# Patient Record
Sex: Male | Born: 1960 | Race: White | Hispanic: No | Marital: Single | Smoking: Former smoker
Health system: Southern US, Community
[De-identification: ages and names within clinical notes are randomized; demographics above are authoritative.]

## PROBLEM LIST (undated history)

## (undated) DIAGNOSIS — E785 Hyperlipidemia, unspecified: Secondary | ICD-10-CM

## (undated) DIAGNOSIS — N400 Enlarged prostate without lower urinary tract symptoms: Secondary | ICD-10-CM

## (undated) DIAGNOSIS — I214 Non-ST elevation (NSTEMI) myocardial infarction: Secondary | ICD-10-CM

## (undated) DIAGNOSIS — R112 Nausea with vomiting, unspecified: Secondary | ICD-10-CM

## (undated) DIAGNOSIS — I10 Essential (primary) hypertension: Secondary | ICD-10-CM

## (undated) DIAGNOSIS — E119 Type 2 diabetes mellitus without complications: Secondary | ICD-10-CM

## (undated) DIAGNOSIS — Z9889 Other specified postprocedural states: Secondary | ICD-10-CM

## (undated) HISTORY — DX: Non-ST elevation (NSTEMI) myocardial infarction: I21.4

## (undated) HISTORY — PX: TONSILLECTOMY: SUR1361

## (undated) HISTORY — PX: HERNIA REPAIR: SHX51

## (undated) HISTORY — DX: Type 2 diabetes mellitus without complications: E11.9

## (undated) HISTORY — DX: Hyperlipidemia, unspecified: E78.5

## (undated) HISTORY — DX: Benign prostatic hyperplasia without lower urinary tract symptoms: N40.0

## (undated) HISTORY — DX: Essential (primary) hypertension: I10

---

## 2010-02-11 ENCOUNTER — Emergency Department (HOSPITAL_COMMUNITY): Admission: EM | Admit: 2010-02-11 | Discharge: 2010-02-11 | Payer: Self-pay | Admitting: Emergency Medicine

## 2010-02-12 ENCOUNTER — Observation Stay (HOSPITAL_COMMUNITY): Admission: EM | Admit: 2010-02-12 | Discharge: 2010-02-13 | Payer: Self-pay | Admitting: Emergency Medicine

## 2010-03-08 ENCOUNTER — Encounter (INDEPENDENT_AMBULATORY_CARE_PROVIDER_SITE_OTHER): Payer: Self-pay | Admitting: *Deleted

## 2010-03-08 DIAGNOSIS — R079 Chest pain, unspecified: Secondary | ICD-10-CM

## 2010-03-08 LAB — CONVERTED CEMR LAB
CO2: 27 meq/L
Calcium: 8.9 mg/dL
Chloride: 106 meq/L
Creatinine, Ser: 0.97 mg/dL
Glomerular Filtration Rate, Af Am: >60 mL/min/{1.73_m2}
Glucose, Bld: 111 mg/dL

## 2010-03-13 ENCOUNTER — Encounter (INDEPENDENT_AMBULATORY_CARE_PROVIDER_SITE_OTHER): Payer: Self-pay | Admitting: *Deleted

## 2010-07-15 ENCOUNTER — Emergency Department (HOSPITAL_COMMUNITY): Admission: EM | Admit: 2010-07-15 | Discharge: 2010-07-15 | Payer: Self-pay | Admitting: Emergency Medicine

## 2010-07-19 ENCOUNTER — Ambulatory Visit (HOSPITAL_COMMUNITY): Admission: RE | Admit: 2010-07-19 | Discharge: 2010-07-19 | Payer: Self-pay | Admitting: General Surgery

## 2011-01-21 NOTE — Letter (Signed)
Summary: Appointment - Missed  [facility]  [address] [place] [address]   Phone: [phone]  Fax: [phone]     March 13, 2010 MRN: [id number]   RONSON HAGINS [address] [place], [place]  [address]   Dear Mr. MERGEN,  Our records indicate you missed your appointment on 03/13/10 DR MCDOWELL                               It is very important that we reach you to reschedule this appointment. We look forward to participating in your health care needs. Please contact us at the number listed above at your earliest convenience to reschedule this appointment.     Sincerely,    Glass blower/designer

## 2011-01-21 NOTE — Miscellaneous (Signed)
Summary: labs hosp 02/13/2010  Clinical Lists Changes  Observations: Added new observation of CALCIUM: 8.9 mg/dL (16/09/9603 54:09) Added new observation of GFR AA: >60 mL/min/1.65m2 (03/08/2010 12:23) Added new observation of GFR: >60 mL/min (03/08/2010 12:23) Added new observation of CREATININE: 0.97 mg/dL (81/19/1478 29:56) Added new observation of BUN: 10 mg/dL (21/30/8657 84:69) Added new observation of BG RANDOM: 111 mg/dL (62/95/2841 32:44) Added new observation of CO2 PLSM/SER: 27 meq/L (03/08/2010 12:23) Added new observation of CL SERUM: 106 meq/L (03/08/2010 12:23) Added new observation of K SERUM: 3.8 meq/L (03/08/2010 12:23) Added new observation of NA: 140 meq/L (03/08/2010 12:23) Added new observation of HGBA1C: 5.9 % (03/08/2010 12:23)

## 2011-03-08 LAB — CBC
MCH: 29.7 pg (ref 26.0–34.0)
MCV: 88.2 fL (ref 78.0–100.0)
Platelets: 295 10*3/uL (ref 150–400)
RBC: 5.02 MIL/uL (ref 4.22–5.81)
RDW: 14.5 % (ref 11.5–15.5)
WBC: 11.6 10*3/uL — ABNORMAL HIGH (ref 4.0–10.5)

## 2011-03-08 LAB — SURGICAL PCR SCREEN: Staphylococcus aureus: NEGATIVE

## 2011-03-08 LAB — BASIC METABOLIC PANEL
BUN: 9 mg/dL (ref 6–23)
CO2: 25 mEq/L (ref 19–32)
Calcium: 9.6 mg/dL (ref 8.4–10.5)
Creatinine, Ser: 0.69 mg/dL (ref 0.4–1.5)

## 2011-03-14 LAB — CARDIAC PANEL(CRET KIN+CKTOT+MB+TROPI)
Relative Index: INVALID (ref 0.0–2.5)
Relative Index: INVALID (ref 0.0–2.5)
Total CK: 69 U/L (ref 7–232)
Troponin I: 0.01 ng/mL (ref 0.00–0.06)
Troponin I: 0.01 ng/mL (ref 0.00–0.06)

## 2011-03-14 LAB — POCT CARDIAC MARKERS
CKMB, poc: <1 ng/mL — ABNORMAL LOW (ref 1.0–8.0)
CKMB, poc: <1 ng/mL — ABNORMAL LOW (ref 1.0–8.0)
Myoglobin, poc: 41.2 ng/mL (ref 12–200)
Myoglobin, poc: 50 ng/mL (ref 12–200)
Troponin i, poc: <0.05 ng/mL (ref 0.00–0.09)

## 2011-03-14 LAB — POCT I-STAT, CHEM 8
BUN: 10 mg/dL (ref 6–23)
HCT: 45 % (ref 39.0–52.0)
Hemoglobin: 15.3 g/dL (ref 13.0–17.0)
TCO2: 27 mmol/L (ref 0–100)

## 2011-03-14 LAB — CBC
MCHC: 33.8 g/dL (ref 30.0–36.0)
MCV: 88.9 fL (ref 78.0–100.0)
RBC: 4.74 MIL/uL (ref 4.22–5.81)

## 2011-03-14 LAB — COMPREHENSIVE METABOLIC PANEL
ALT: 16 U/L (ref 0–53)
AST: 14 U/L (ref 0–37)
Albumin: 3.7 g/dL (ref 3.5–5.2)
Alkaline Phosphatase: 73 U/L (ref 39–117)
BUN: 6 mg/dL (ref 6–23)
GFR calc Af Amer: >60 mL/min (ref >60)
GFR calc non Af Amer: >60 mL/min (ref >60)
Glucose, Bld: 119 mg/dL — ABNORMAL HIGH (ref 70–99)
Potassium: 3.7 mEq/L (ref 3.5–5.1)
Sodium: 138 mEq/L (ref 135–145)
Total Bilirubin: 0.5 mg/dL (ref 0.3–1.2)
Total Protein: 6.3 g/dL (ref 6.0–8.3)

## 2011-03-14 LAB — BASIC METABOLIC PANEL
CO2: 27 mEq/L (ref 19–32)
Calcium: 8.9 mg/dL (ref 8.4–10.5)
Chloride: 106 mEq/L (ref 96–112)
GFR calc Af Amer: >60 mL/min (ref >60)
GFR calc non Af Amer: >60 mL/min (ref >60)
Potassium: 3.8 mEq/L (ref 3.5–5.1)
Sodium: 140 mEq/L (ref 135–145)

## 2011-03-14 LAB — DIFFERENTIAL
Basophils Absolute: 0 10*3/uL (ref 0.0–0.1)
Basophils Relative: 0 % (ref 0–1)
Lymphocytes Relative: 31 % (ref 12–46)
Monocytes Absolute: 0.7 10*3/uL (ref 0.1–1.0)
Neutro Abs: 6.4 10*3/uL (ref 1.7–7.7)
Neutrophils Relative %: 61 % (ref 43–77)

## 2011-03-14 LAB — LIPID PANEL
HDL: 35 mg/dL — ABNORMAL LOW (ref >39)
LDL Cholesterol: 103 mg/dL — ABNORMAL HIGH (ref 0–99)

## 2011-03-14 LAB — HEMOGLOBIN A1C: Hgb A1c MFr Bld: 5.9 % (ref 4.6–6.1)

## 2011-07-29 ENCOUNTER — Emergency Department (HOSPITAL_COMMUNITY): Payer: BC Managed Care – PPO

## 2011-07-29 ENCOUNTER — Other Ambulatory Visit: Payer: Self-pay

## 2011-07-29 ENCOUNTER — Emergency Department (HOSPITAL_COMMUNITY)
Admission: EM | Admit: 2011-07-29 | Discharge: 2011-07-29 | Disposition: A | Discharge status: Home / Self Care | Payer: BC Managed Care – PPO | Attending: Emergency Medicine | Admitting: Emergency Medicine

## 2011-07-29 ENCOUNTER — Encounter: Payer: Self-pay | Admitting: Emergency Medicine

## 2011-07-29 DIAGNOSIS — R079 Chest pain, unspecified: Secondary | ICD-10-CM

## 2011-07-29 DIAGNOSIS — M79609 Pain in unspecified limb: Secondary | ICD-10-CM | POA: Insufficient documentation

## 2011-07-29 LAB — CBC
HCT: 43.9 % (ref 39.0–52.0)
MCH: 29.4 pg (ref 26.0–34.0)
MCHC: 33.9 g/dL (ref 30.0–36.0)
Platelets: 305 10*3/uL (ref 150–400)
WBC: 11.5 10*3/uL — ABNORMAL HIGH (ref 4.0–10.5)

## 2011-07-29 LAB — COMPREHENSIVE METABOLIC PANEL
ALT: 29 U/L (ref 0–53)
AST: 19 U/L (ref 0–37)
Albumin: 3.9 g/dL (ref 3.5–5.2)
BUN: 13 mg/dL (ref 6–23)
Creatinine, Ser: 0.72 mg/dL (ref 0.50–1.35)
Total Protein: 7.2 g/dL (ref 6.0–8.3)

## 2011-07-29 LAB — CARDIAC PANEL(CRET KIN+CKTOT+MB+TROPI)
Relative Index: INVALID (ref 0.0–2.5)
Total CK: 93 U/L (ref 7–232)
Troponin I: <0.3 ng/mL (ref <0.30)

## 2011-07-29 MED ORDER — SODIUM CHLORIDE 0.9 % IV SOLN
20.0000 mL | INTRAVENOUS | Status: DC
  Administered 2011-07-29: 1000 mL via INTRAVENOUS

## 2011-07-29 MED ORDER — NITROGLYCERIN 0.4 MG SL SUBL
0.4000 mg | SUBLINGUAL_TABLET | Freq: Once | SUBLINGUAL | Status: AC
  Administered 2011-07-29: 0.4 mg via SUBLINGUAL
  Filled 2011-07-29: qty 25

## 2011-07-29 NOTE — ED Notes (Signed)
Patient reports 2/10 chest pain with Nitro SL. Patient also stating the nitro helped with his headache too. BP 118/78 after Nitro.

## 2011-07-29 NOTE — ED Provider Notes (Signed)
History   Chart scribed for Joya Gaskins, MD by Enos Fling; the patient was seen in room APA05/APA05; this patient's care was started at 10:20 AM.    CSN: [id number] Arrival date & time: 07/29/2011 10:15 AM  Chief Complaint  Patient presents with  . Chest Pain   HPI Seth [name] is a 50 y.o. male who presents to the Emergency Department complaining of chest pain. Pt reports chest pain onset 10 days ago, described as intermittent soreness and pressure in his central chest (pain is present "half the time I am awake"). Pain radiates to left arm occasionally, is worse when lying or sitting still, but better with walking around and activity. Since onset of pain, pt has been drinking a lot of water, stopped smoking, and has been taking 4 ASA a day. 2 ASA today. Current pain described as a mild soreness. Pt states diaphoresis is usual for him and is unchanged, states he does not do well in the heat and he has been outside a lot in it lately. No n/v, abd pain, numbness, or focal weakness.. Also c/o headache right now.  PAST MEDICAL HISTORY:  Past Medical History  Diagnosis Date  . Unstable angina     PAST SURGICAL HISTORY:  Past Surgical History  Procedure Date  . Hernia repair   . Tonsillectomy     MEDICATIONS:  Previous Medications   ASPIRIN 325 MG TABLET    Take 325 mg by mouth every 6 (six) hours as needed. For chest pain    FISH OIL-OMEGA-3 FATTY ACIDS 1000 MG CAPSULE    Take 1 g by mouth daily.     IBUPROFEN (ADVIL,MOTRIN) 200 MG TABLET    Take 800 mg by mouth every 6 (six) hours as needed. For pain      ALLERGIES:  Allergies as of 07/29/2011 - Review Complete 07/29/2011  Allergen Reaction Noted  . Penicillins Other (See Comments) 03/08/2010     FAMILY HISTORY:  Family History  Problem Relation Age of Onset  . Cancer Mother   . Cancer Father   . Anuerysm Sister   . Anuerysm Brother      SOCIAL HISTORY: History   Social History  . Marital Status: Single    Spouse Name: N/A    Number of Children: N/A  . Years of Education: N/A   Social History Main Topics  . Smoking status: Former Smoker    Quit date: 07/19/2011  . Smokeless tobacco: Never Used  . Alcohol Use: No  . Drug Use: .5 per week    Special: Marijuana  . Sexually Active: No   Other Topics Concern  . None   Social History Narrative  . None      Review of Systems 10 Systems reviewed and are negative for acute change except as noted in the HPI.  Physical Exam  BP 122/62  Pulse 78  Temp(Src) 98.7 F (37.1 C) (Oral)  Resp 20  Ht 6\' 3"  (1.905 m)  Wt 228 lb (103.42 kg)  BMI 28.50 kg/m2  SpO2 96%  Physical Exam CONSTITUTIONAL: Well developed/well nourished HEAD AND FACE: Normocephalic/atraumatic EYES: EOMI/PERRL ENMT: Mucous membranes moist NECK: supple no meningeal signs SPINE:entire spine nontender CV: S1/S2 noted, no murmurs/rubs/gallops noted LUNGS: Lungs are clear to auscultation bilaterally, no apparent distress ABDOMEN: soft, nontender, no rebound or guarding GU:no cva tenderness NEURO: Pt is awake/alert, moves all extremitiesx4 EXTREMITIES: pulses normal, full ROM SKIN: warm, color normal PSYCH: no abnormalities of mood noted  ED Course  Procedures  OTHER DATA REVIEWED: Nursing notes, vital signs, and past medical records reviewed. All labs/vitals reviewed and considered xrays reviewed and considered  Pt admitted in 2011 for chest pain, did not have any provocative imaging/cath  DIAGNOSTIC STUDIES: Oxygen Saturation is 96% on room air, normal by my interpretation.    Date: 07/29/2011  Rate: 86  Rhythm: normal sinus rhythm  QRS Axis: normal  Intervals: normal  ST/T Wave abnormalities: nonspecific ST changes  Conduction Disutrbances:none  Narrative Interpretation:   Old EKG Reviewed: unchanged  3:12 PM - Repeat EKG unchanged from prior EKG today.   LABS / RADIOLOGY: labs unremarkable, cxr reviewed   MDM: On reexam, pt reports pain is  burning at times, no sob/nausea with pain, actually improved with exertion He had no change with NTG On re-eval my suspicion for ACS is low.  (1:11 PM) Pt had told nursing he had unstable angina before but I seen no record of this D/w dr Dietrich Pates, will f/u in office with likely stress imaging 3:12 PM - Repeat EKG unchanged from prior EKG today. Repeat Troponin also normal. Will discharge with outpatient cardiology f/u.   IMPRESSION: Chest pain     PLAN:  Discharge home The patient is to return the emergency department if there is any worsening of symptoms. I have reviewed the discharge instructions with the patient   CONDITION ON DISCHARGE: stable   MEDICATIONS GIVEN IN THE E.D.  Medications  0.9 %  sodium chloride infusion (1000 mL Intravenous New Bag 07/29/11 1037)        nitroGLYCERIN (NITROSTAT) SL tablet 0.4 mg (0.4 mg Sublingual Given 07/29/11 1039)     DISCHARGE MEDICATIONS: New Prescriptions   No medications on file    I personally performed the services described in this documentation, which was scribed in my presence. The recorded information has been reviewed and considered. Joya Gaskins, MD        Joya Gaskins, MD 07/29/11 [phone]

## 2011-07-29 NOTE — ED Notes (Addendum)
Patient c/o left side chest pain that started 10 days ago. Per patient pain comes and goes. Patient states "It doesn't hurt when I'm working and stuff. It hurts when I'm sitting or lying." Per patient a lot of stress and contributed it to heat. Patient reports "I quit smoking when it started, drank lots of water, and have been taking aspirin but it hasn't gotten any better." Patient states pain now radiates into left arm and left hip. Patient also reports, dizziness, facial numbness, diaphoresis, and weakness. No diaphoresis noted at this time. Denies any nausea, vomiting, or shortness of breath. HX of unstable angina. Patient also c/o constant headache.

## 2011-07-29 NOTE — ED Notes (Signed)
Patient denies chest pain at this time. Skin warm/dry. Patient resting in bed. Will continue to monitor.

## 2011-08-04 ENCOUNTER — Ambulatory Visit (INDEPENDENT_AMBULATORY_CARE_PROVIDER_SITE_OTHER): Payer: BC Managed Care – PPO | Admitting: Adult Health

## 2011-08-04 ENCOUNTER — Encounter: Payer: Self-pay | Admitting: Adult Health

## 2011-08-04 DIAGNOSIS — IMO0001 Reserved for inherently not codable concepts without codable children: Secondary | ICD-10-CM

## 2011-08-04 DIAGNOSIS — M7918 Myalgia, other site: Secondary | ICD-10-CM | POA: Insufficient documentation

## 2011-08-04 DIAGNOSIS — R079 Chest pain, unspecified: Secondary | ICD-10-CM

## 2011-08-04 MED ORDER — NAPROXEN 375 MG PO TABS: 375.0000 mg | ORAL_TABLET | Freq: Three times a day (TID) | ORAL | Status: AC

## 2011-08-04 MED ORDER — CYCLOBENZAPRINE HCL 10 MG PO TABS: 10.0000 mg | ORAL_TABLET | Freq: Three times a day (TID) | ORAL | Status: AC | PRN

## 2011-08-04 NOTE — Progress Notes (Signed)
HPI:   Allergies  Allergen Reactions  . Penicillins Other (See Comments)    Childhood allergy    Current Outpatient Prescriptions  Medication Sig Dispense Refill  . aspirin 325 MG tablet Take 325 mg by mouth every 6 (six) hours as needed. For chest pain       . fish oil-omega-3 fatty acids 1000 MG capsule Take 1 g by mouth daily.        Marland Kitchen ibuprofen (ADVIL,MOTRIN) 200 MG tablet Take 800 mg by mouth every 6 (six) hours as needed. For pain       . cyclobenzaprine (FLEXERIL) 10 MG tablet Take 1 tablet (10 mg total) by mouth 3 (three) times daily as needed for muscle spasms.  90 tablet  0  . naproxen (NAPROSYN) 375 MG tablet Take 1 tablet (375 mg total) by mouth 3 (three) times daily with meals.  15 tablet  2    No past medical history on file.  Past Surgical History  Procedure Date  . Hernia repair   . Tonsillectomy   ROS: Review of systems complete and found to be negative unless listed above  PE::General: Well developed, well nourished, in no acute distress Head: Eyes PERRLA, No xanthomas.   Normal cephalic and atramatic  Lungs: Clear bilaterally to auscultation and percussion. Heart: HRRR S1 S2,.  Pulses are 2+ & equal.            No carotid bruit. No JVD.  No abdominal bruits. No femoral bruits. Abdomen: Bowel sounds are positive, abdomen soft and non-tender without masses or                  Hernia's noted. Msk:  Back normal, normal gait. Normal strength and tone for age. Extremities: No clubbing, cyanosis or edema.  DP +1, soreness with moving the neck and left arm and shoulder. Neuro: Alert and oriented X 3. Psych:  Good affect, responds appropriately BP 120/77  Pulse 79  Resp 18  Ht 6\' 3"  (1.905 m)  Wt 226 lb (102.513 kg)  BMI 28.25 kg/m2  SpO2 97%  EKG:NSR rate of 71 bpm  ASSESSMENT AND PLAN

## 2011-08-04 NOTE — Assessment & Plan Note (Signed)
This pain appears to be atypical for cardiac pain. Usually associated with movement. It is reproducible with movement of the head, left shouldr and left arm, but not severe.  EKG is normal.  He has been seen and examined by Dr. Dietrich Pates and myself.  Will start him on Naprosyn 375 mg TID with food for 5 days, then prn.  He will also be started on flexeril 10 mg TID for muscle spasms, taken prn.  He will return to [place] PRN only. If pain does not subside, should be seen by orthopedic physician.

## 2011-08-04 NOTE — Patient Instructions (Signed)
Your physician has recommended you make the following change in your medication: Take Flexeril 10 mg three times a day as needed and Naprosyn 375 mg three times a day with food for 5 days  Your physician recommends that you schedule a follow-up appointment in: as needed

## 2012-08-21 IMAGING — CR DG CHEST 2V
3 series · 3 of 3 positions shown · non-contrast
Comparison: 02/11/2010

CLINICAL DATA: chest pain.

CHEST - 2 VIEW

[view not recorded (1 of 3)]
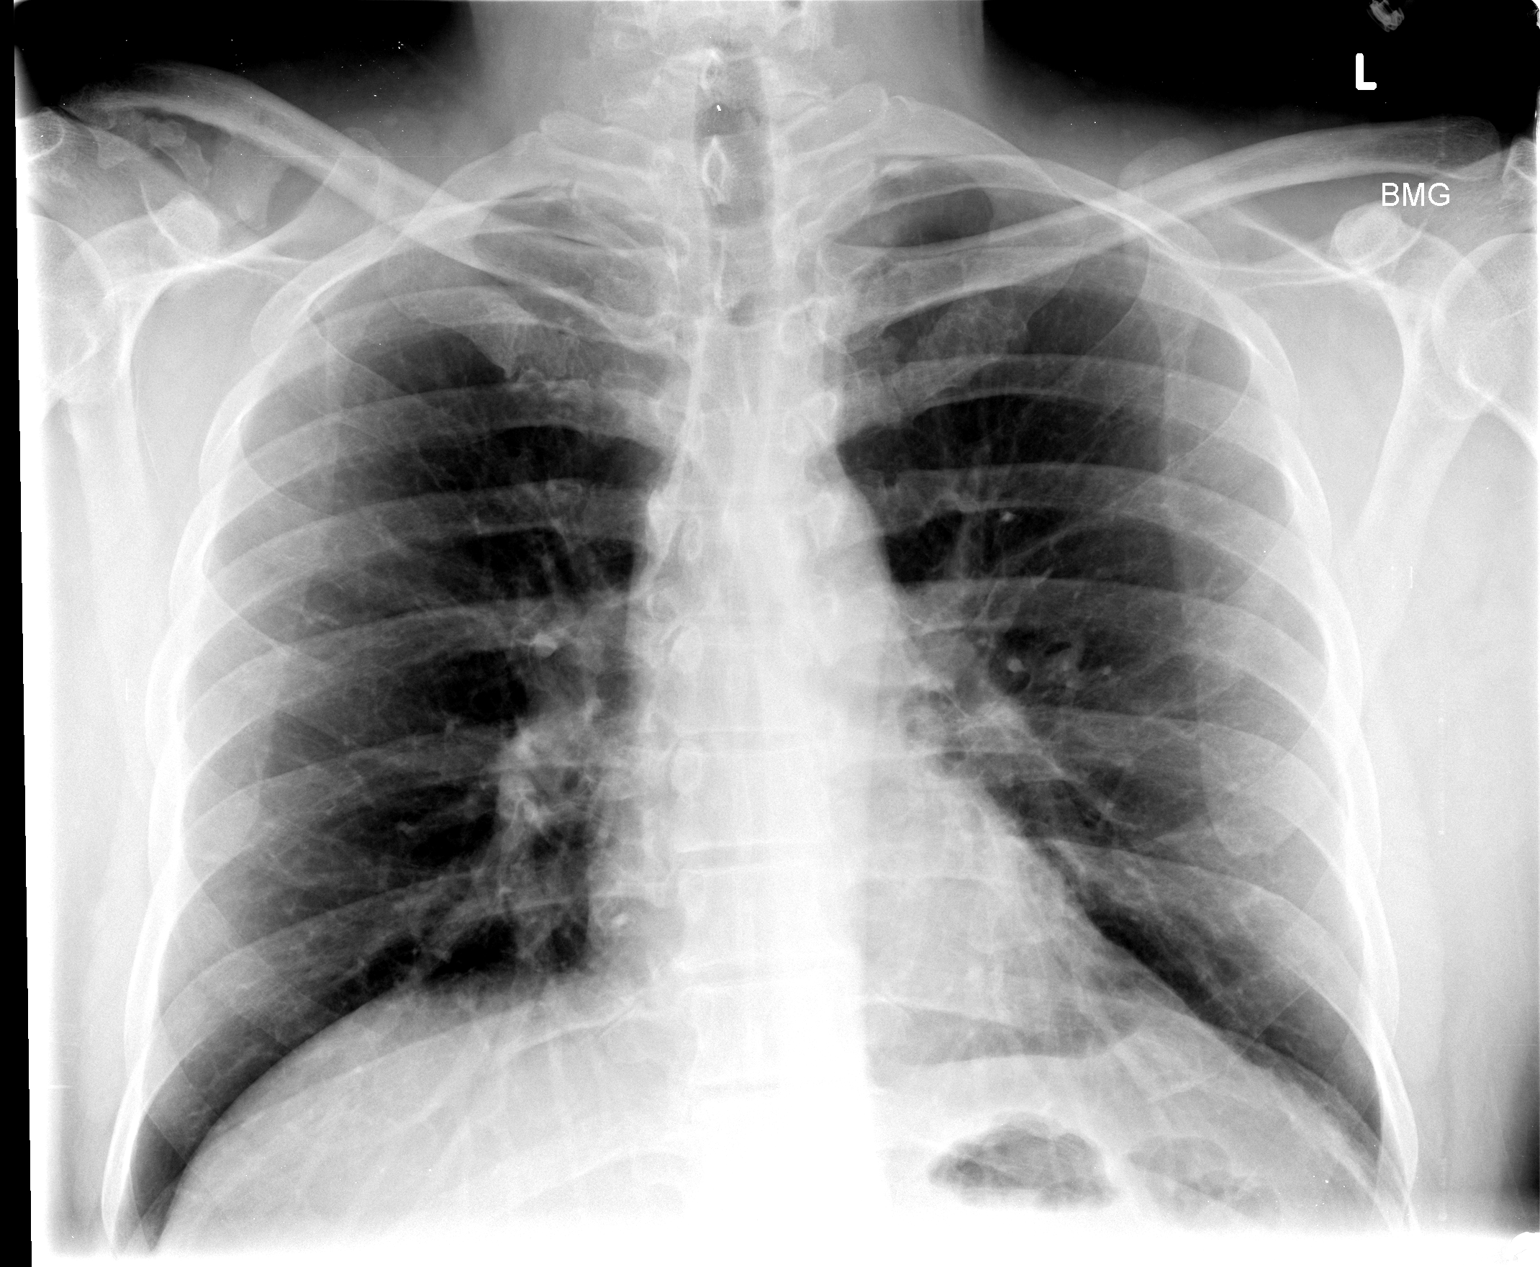

[view not recorded (2 of 3)]
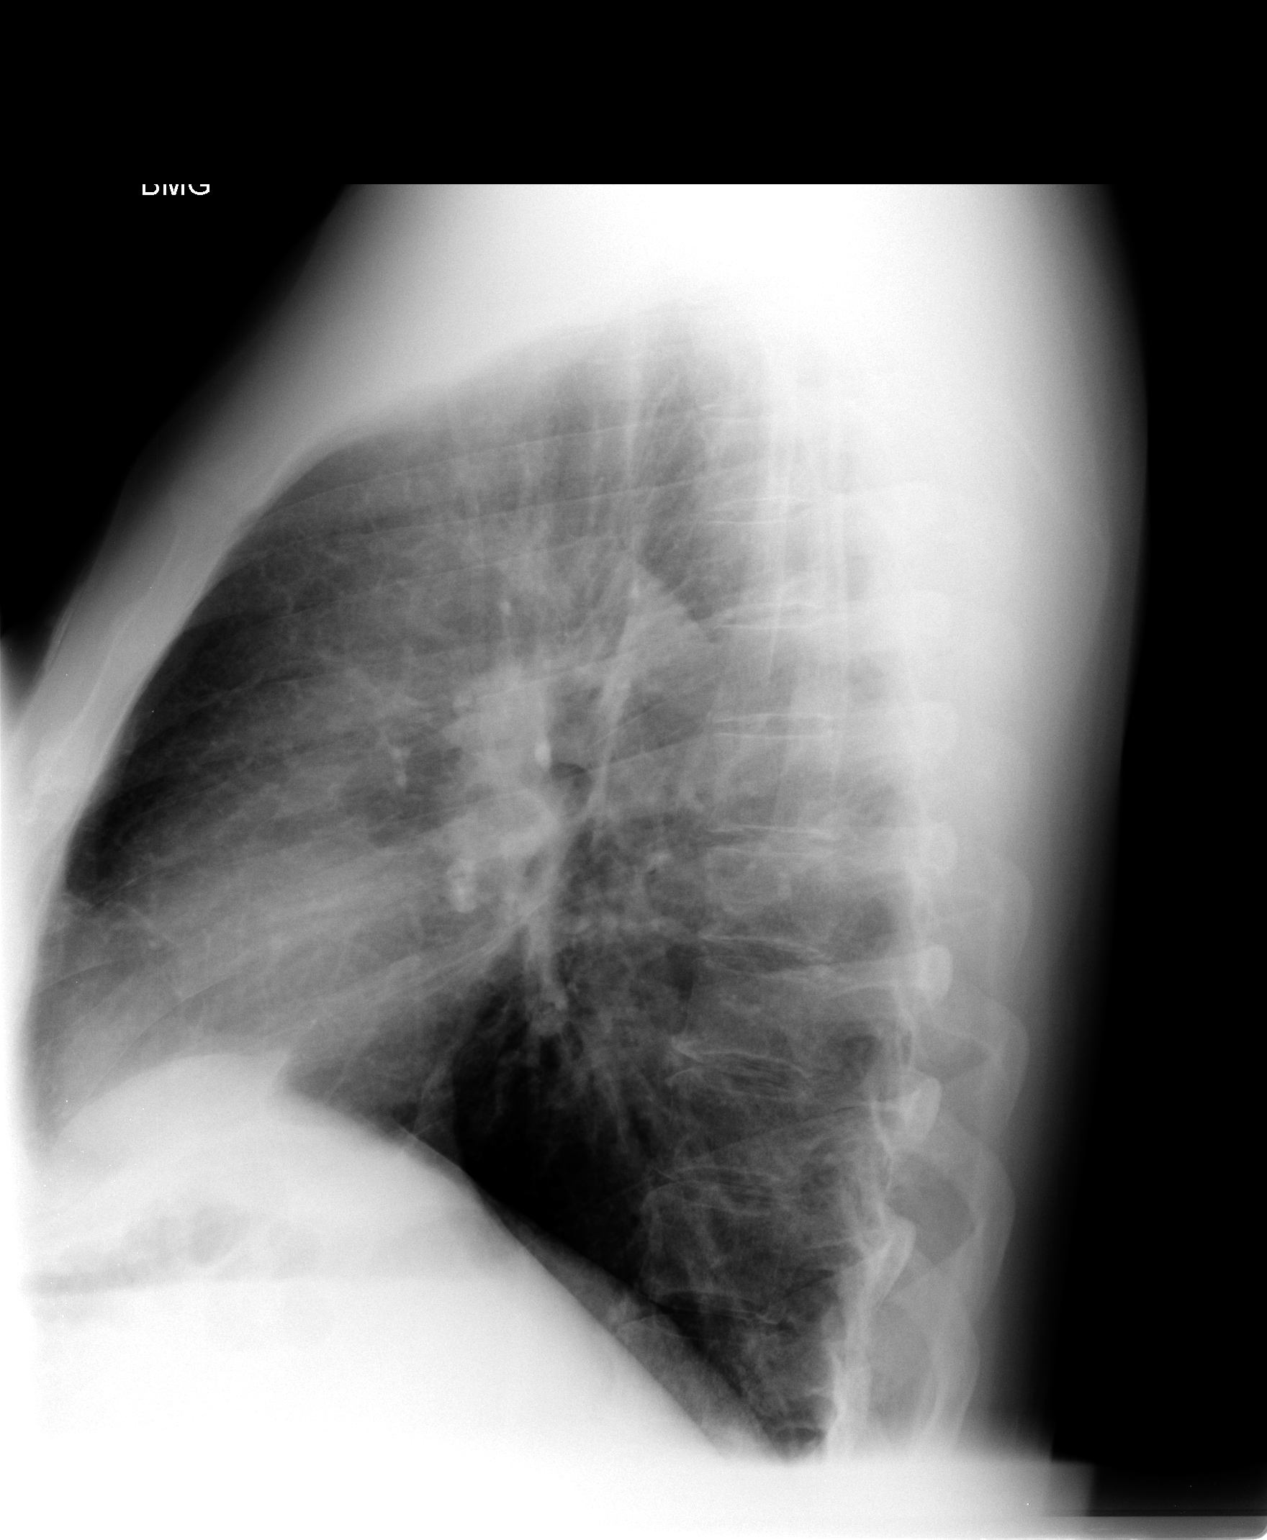

[view not recorded (3 of 3)]
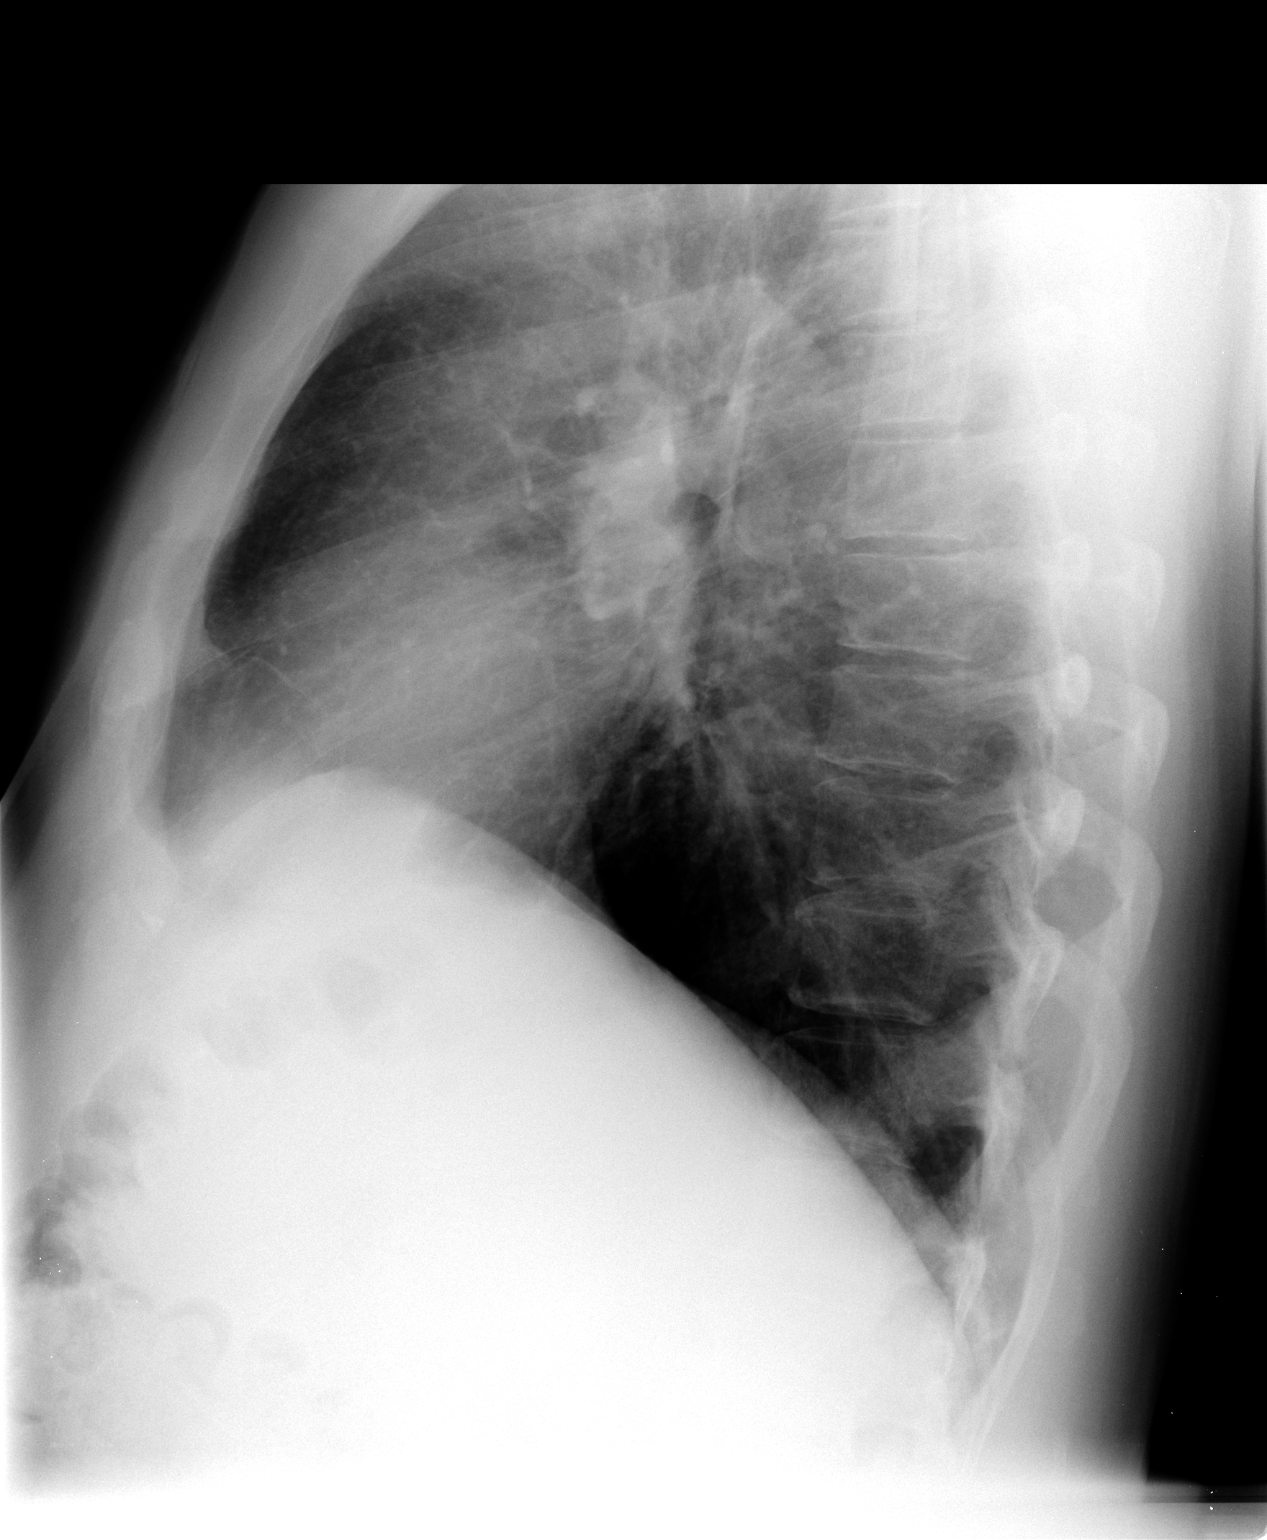

[3 of 3 positions shown; findings below may reference images not displayed]

FINDINGS: Unchanged cardiac silhouette and mediastinal contours.
No focal parenchymal opacities.  No pleural effusion or
pneumothorax.  Redemonstrated likely benign bony exercrences
arising from the inferior surface of the right clavicle, grossly
unchanged, though incompletely evaluated.  No acute osseous
abnormalities.
IMPRESSION: No acute cardiopulmonary disease.

## 2015-02-13 NOTE — H&P (Signed)
  NTS SOAP Note  Vital Signs:  Vitals as of: 02/08/2015: Systolic 142: Diastolic 81: Heart Rate 85: Temp 98.79F: Height 256ft 3in: Weight 240Lbs 0 Ounces: Pain Level 3: BMI 30  BMI : 30 kg/m2  Subjective: This 54 year old male presents for of a left inguinal hernia.  Was lifting a heavy dog at work and developed pain and swelling in the left groin region in Nov of 2015,   Has not resolved since that time.  Did not have these symptoms prior to this incident.  Made worse with straining.  Lump does get smaller when lying down.  Review of Symptoms:  Constitutional:unremarkable   Head:unremarkable Eyes:unremarkable   Nose/Mouth/Throat:unremarkable Cardiovascular:  unremarkable Respiratory:unremarkable Gastrointestinabdominal pain Genitourinary:unremarkable   back pain Skin:unremarkable Hematolgic/Lymphatic:unremarkable   hay fever   Past Medical History:  Reviewed  Past Medical History  Surgical History: RIH Medical Problems: none Allergies: pcn Medications: none   Social History:Reviewed  Social History  Preferred Language: English Race:  White Ethnicity: Not Hispanic / Latino Age: [age] year Marital Status:  S Alcohol: unknown   Smoking Status: Current every day smoker reviewed on 02/08/2015 Started Date:  Packs per day: 0.50 Functional Status reviewed on 02/08/2015 ------------------------------------------------ Bathing: Normal Cooking: Normal Dressing: Normal Driving: Normal Eating: Normal Managing Meds: Normal Oral Care: Normal Shopping: Normal Toileting: Normal Transferring: Normal Walking: Normal Cognitive Status reviewed on 02/08/2015 ------------------------------------------------ Attention: Normal Decision Making: Normal Language: Normal Memory: Normal Motor: Normal Perception: Normal Problem Solving: Normal Visual and Spatial: Normal   Family History:Reviewed  Family Health History Mother, Living; Healthy;  Father,  Deceased; Healthy;     Objective Information: General:Well appearing, well nourished in no distress. Heart:RRR, no murmur or gallop.  Normal S1, S2.  No S3, S4.  Lungs:  CTA bilaterally, no wheezes, rhonchi, rales.  Breathing unlabored. Abdomen:Soft, NT/ND, no HSM, no masses.  Reducible left inguinal hernia. WU:[id number]:unremarkable    Assessment:Left inguinal hernia  Diagnoses: 550.90  K40.90 Inguinal hernia (Unilateral inguinal hernia, without obstruction or gangrene, not specified as recurrent)  Procedures: [id number] - OFFICE OUTPATIENT NEW 30 MINUTES    Plan:  Scheduled for left inguinal herniorrhaphy with mesh on 02/16/15.   Patient Education:Alternative treatments to surgery were discussed with patient (and family).  Risks and benefits  of procedure including bleeding,  infection,  mesh use,  and recurrence of the hernia were fully explained to the patient (and family) who gave informed consent. Patient/family questions were addressed.  Follow-up:Pending Surgery

## 2015-02-14 NOTE — Patient Instructions (Addendum)
Seth [name]  02/14/2015   Your procedure is scheduled on:  02/16/2015  [facility] at [facility]:15 AM.  Call this number if you have problems the morning of surgery: [phone]   Remember:   Do not eat food or drink liquids after midnight.   Take these medicines the morning of surgery with A SIP OF WATER: *none**   Do not wear jewelry, make-up or nail polish.  Do not wear lotions, powders, or perfumes. You may wear deodorant.  Do not shave 48 hours prior to surgery. Men may shave face and neck.  Do not bring valuables to the hospital.  [facility] not [facility]                  for any belongings or valuables.               Contacts, dentures or bridgework may not be worn into surgery.  Leave suitcase in the car. After surgery it may be brought to your room.  For patients admitted to the hospital, discharge time is determined by your                treatment team.               Patients discharged the day of surgery will not be allowed to drive  home.  Name and phone number of your driver: family  Special Instructions: Shower using CHG 2 nights before surgery and the night before surgery.  If you shower the day of surgery use CHG.  Use special wash - you have one bottle of CHG for all showers.  You should use approximately 1/3 of the bottle for each shower.   Please read over the following fact sheets that you were given: Anesthesia Post-op Instructions        PATIENT INSTRUCTIONS POST-ANESTHESIA  IMMEDIATELY FOLLOWING SURGERY:  Do not drive or operate machinery for the first twenty four hours after surgery.  Do not make any important decisions for twenty four hours after surgery or while taking narcotic pain medications or sedatives.  If you develop intractable nausea and vomiting or a severe headache please notify your doctor immediately.  FOLLOW-UP:  Please make an appointment with your surgeon as instructed. You do not need to follow up with anesthesia unless  specifically instructed to do so.  WOUND CARE INSTRUCTIONS (if applicable):  Keep a dry clean dressing on the anesthesia/puncture wound site if there is drainage.  Once the wound has quit draining you may leave it open to air.  Generally you should leave the bandage intact for twenty four hours unless there is drainage.  If the epidural site drains for more than 36-48 hours please call the anesthesia department.  QUESTIONS?:  Please feel free to call your physician or the hospital operator if you have any questions, and they will be happy to assist you.      Hernia A hernia occurs when an internal organ pushes out through a weak spot in the abdominal wall. Hernias most commonly occur in the groin and around the navel. Hernias often can be pushed back into place (reduced). Most hernias tend to get worse over time. Some abdominal hernias can get stuck in the opening (irreducible or incarcerated hernia) and cannot be reduced. An irreducible abdominal hernia which is tightly squeezed into the opening is at risk for impaired blood supply (strangulated hernia). A strangulated hernia is a medical emergency. Because of the risk for an  irreducible or strangulated hernia, surgery may be recommended to repair a hernia. CAUSES   Heavy lifting.  Prolonged coughing.  Straining to have a bowel movement.  A cut (incision) made during an abdominal surgery. HOME CARE INSTRUCTIONS   Bed rest is not required. You may continue your normal activities.  Avoid lifting more than 10 pounds (4.5 kg) or straining.  Cough gently. If you are a smoker it is best to stop. Even the best hernia repair can break down with the continual strain of coughing. Even if you do not have your hernia repaired, a cough will continue to aggravate the problem.  Do not wear anything tight over your hernia. Do not try to keep it in with an outside bandage or truss. These can damage abdominal contents if they are trapped within the hernia  sac.  Eat a normal diet.  Avoid constipation. Straining over long periods of time will increase hernia size and encourage breakdown of repairs. If you cannot do this with diet alone, stool softeners may be used. SEEK IMMEDIATE MEDICAL CARE IF:   You have a fever.  You develop increasing abdominal pain.  You feel nauseous or vomit.  Your hernia is stuck outside the abdomen, looks discolored, feels hard, or is tender.  You have any changes in your bowel habits or in the hernia that are unusual for you.  You have increased pain or swelling around the hernia.  You cannot push the hernia back in place by applying gentle pressure while lying down. MAKE SURE YOU:   Understand these instructions.  Will watch your condition.  Will get help right away if you are not doing well or get worse. Document Released: 12/08/2005 Document Revised: 03/01/2012 Document Reviewed: 07/27/2008 [facility] Patient Information 2015 [place], [place]. This information is not intended to replace advice given to you by your health care provider. Make sure you discuss any questions you have with your health care provider.

## 2015-02-15 ENCOUNTER — Encounter (HOSPITAL_COMMUNITY): Payer: Self-pay

## 2015-02-15 ENCOUNTER — Encounter (HOSPITAL_COMMUNITY)
Admission: RE | Admit: 2015-02-15 | Discharge: 2015-02-15 | Disposition: A | Discharge status: Home / Self Care | Payer: Worker's Compensation | Source: Ambulatory Visit | Attending: General Surgery | Admitting: General Surgery

## 2015-02-15 DIAGNOSIS — K4090 Unilateral inguinal hernia, without obstruction or gangrene, not specified as recurrent: Secondary | ICD-10-CM | POA: Insufficient documentation

## 2015-02-15 DIAGNOSIS — Z01818 Encounter for other preprocedural examination: Secondary | ICD-10-CM | POA: Insufficient documentation

## 2015-02-15 HISTORY — DX: Nausea with vomiting, unspecified: R11.2

## 2015-02-15 HISTORY — DX: Other specified postprocedural states: Z98.890

## 2015-02-15 LAB — CBC WITH DIFFERENTIAL/PLATELET
Basophils Absolute: 0 10*3/uL (ref 0.0–0.1)
Basophils Relative: 0 % (ref 0–1)
EOS ABS: 0.3 10*3/uL (ref 0.0–0.7)
Eosinophils Relative: 2 % (ref 0–5)
HCT: 44 % (ref 39.0–52.0)
Hemoglobin: 14.8 g/dL (ref 13.0–17.0)
LYMPHS ABS: 3.1 10*3/uL (ref 0.7–4.0)
Lymphocytes Relative: 28 % (ref 12–46)
MCH: 29.7 pg (ref 26.0–34.0)
MCHC: 33.6 g/dL (ref 30.0–36.0)
MCV: 88.4 fL (ref 78.0–100.0)
MONOS PCT: 7 % (ref 3–12)
Monocytes Absolute: 0.8 10*3/uL (ref 0.1–1.0)
NEUTROS PCT: 63 % (ref 43–77)
Neutro Abs: 7 10*3/uL (ref 1.7–7.7)
Platelets: 327 10*3/uL (ref 150–400)
RBC: 4.98 MIL/uL (ref 4.22–5.81)
RDW: 14.4 % (ref 11.5–15.5)
WBC: 11.2 10*3/uL — AB (ref 4.0–10.5)

## 2015-02-15 LAB — BASIC METABOLIC PANEL
Anion gap: 3 — ABNORMAL LOW (ref 5–15)
BUN: 12 mg/dL (ref 6–23)
CALCIUM: 9 mg/dL (ref 8.4–10.5)
CO2: 25 mmol/L (ref 19–32)
CREATININE: 0.66 mg/dL (ref 0.50–1.35)
Chloride: 109 mmol/L (ref 96–112)
GFR calc Af Amer: >90 mL/min (ref >90)
GLUCOSE: 128 mg/dL — AB (ref 70–99)
Potassium: 4.3 mmol/L (ref 3.5–5.1)
SODIUM: 137 mmol/L (ref 135–145)

## 2015-02-15 NOTE — Pre-Procedure Instructions (Signed)
Patient given information to sign up for my chart at home. 

## 2015-02-16 ENCOUNTER — Ambulatory Visit (HOSPITAL_COMMUNITY)
Admission: RE | Admit: 2015-02-16 | Discharge: 2015-02-16 | Disposition: A | Discharge status: Home / Self Care | Payer: BLUE CROSS/BLUE SHIELD | Source: Ambulatory Visit | Attending: General Surgery | Admitting: General Surgery

## 2015-02-16 ENCOUNTER — Ambulatory Visit (HOSPITAL_COMMUNITY): Payer: BLUE CROSS/BLUE SHIELD | Admitting: Anesthesiology

## 2015-02-16 ENCOUNTER — Encounter (HOSPITAL_COMMUNITY)
Admission: RE | Disposition: A | Discharge status: Home / Self Care | Payer: Self-pay | Source: Ambulatory Visit | Attending: General Surgery

## 2015-02-16 ENCOUNTER — Encounter (HOSPITAL_COMMUNITY): Payer: Self-pay | Admitting: *Deleted

## 2015-02-16 DIAGNOSIS — Z87891 Personal history of nicotine dependence: Secondary | ICD-10-CM | POA: Diagnosis not present

## 2015-02-16 DIAGNOSIS — K4090 Unilateral inguinal hernia, without obstruction or gangrene, not specified as recurrent: Secondary | ICD-10-CM | POA: Diagnosis not present

## 2015-02-16 HISTORY — PX: INSERTION OF MESH: SHX5868

## 2015-02-16 HISTORY — PX: INGUINAL HERNIA REPAIR: SHX194

## 2015-02-16 SURGERY — REPAIR, HERNIA, INGUINAL, ADULT
Anesthesia: General | Site: Abdomen | Laterality: Left

## 2015-02-16 MED ORDER — DEXAMETHASONE SODIUM PHOSPHATE 4 MG/ML IJ SOLN
4.0000 mg | Freq: Once | INTRAMUSCULAR | Status: AC
  Administered 2015-02-16: 4 mg via INTRAVENOUS

## 2015-02-16 MED ORDER — LACTATED RINGERS IV SOLN
INTRAVENOUS | Status: DC
  Administered 2015-02-16: 1000 mL via INTRAVENOUS

## 2015-02-16 MED ORDER — ONDANSETRON HCL 4 MG/2ML IJ SOLN
INTRAMUSCULAR | Status: AC
  Filled 2015-02-16: qty 2

## 2015-02-16 MED ORDER — FENTANYL CITRATE 0.05 MG/ML IJ SOLN
25.0000 ug | INTRAMUSCULAR | Status: AC
  Administered 2015-02-16 (×2): 25 ug via INTRAVENOUS

## 2015-02-16 MED ORDER — SUCCINYLCHOLINE CHLORIDE 20 MG/ML IJ SOLN
INTRAMUSCULAR | Status: AC
  Filled 2015-02-16: qty 1

## 2015-02-16 MED ORDER — OXYCODONE-ACETAMINOPHEN 7.5-325 MG PO TABS: 1.0000 | ORAL_TABLET | ORAL | Status: DC | PRN

## 2015-02-16 MED ORDER — SODIUM CHLORIDE 0.9 % IV SOLN
INTRAVENOUS | Status: DC | PRN
  Administered 2015-02-16: 07:00:00 via INTRAVENOUS

## 2015-02-16 MED ORDER — KETOROLAC TROMETHAMINE 30 MG/ML IJ SOLN
30.0000 mg | Freq: Once | INTRAMUSCULAR | Status: AC
  Administered 2015-02-16: 30 mg via INTRAVENOUS
  Filled 2015-02-16: qty 1

## 2015-02-16 MED ORDER — SCOPOLAMINE 1 MG/3DAYS TD PT72
MEDICATED_PATCH | TRANSDERMAL | Status: AC
  Filled 2015-02-16: qty 1

## 2015-02-16 MED ORDER — PROPOFOL 10 MG/ML IV BOLUS
INTRAVENOUS | Status: AC
  Filled 2015-02-16: qty 20

## 2015-02-16 MED ORDER — FENTANYL CITRATE 0.05 MG/ML IJ SOLN
INTRAMUSCULAR | Status: AC
  Filled 2015-02-16: qty 5

## 2015-02-16 MED ORDER — BUPIVACAINE LIPOSOME 1.3 % IJ SUSP
INTRAMUSCULAR | Status: DC | PRN
  Administered 2015-02-16: 20 mL

## 2015-02-16 MED ORDER — MIDAZOLAM HCL 2 MG/2ML IJ SOLN
INTRAMUSCULAR | Status: AC
  Filled 2015-02-16: qty 2

## 2015-02-16 MED ORDER — DEXAMETHASONE SODIUM PHOSPHATE 4 MG/ML IJ SOLN
INTRAMUSCULAR | Status: AC
  Filled 2015-02-16: qty 1

## 2015-02-16 MED ORDER — LIDOCAINE HCL 1 % IJ SOLN
INTRAMUSCULAR | Status: DC | PRN
  Administered 2015-02-16: 40 mg via INTRADERMAL

## 2015-02-16 MED ORDER — LIDOCAINE HCL (PF) 1 % IJ SOLN
INTRAMUSCULAR | Status: AC
  Filled 2015-02-16: qty 5

## 2015-02-16 MED ORDER — GLYCOPYRROLATE 0.2 MG/ML IJ SOLN
INTRAMUSCULAR | Status: AC
  Filled 2015-02-16: qty 1

## 2015-02-16 MED ORDER — ONDANSETRON HCL 4 MG/2ML IJ SOLN: 4.0000 mg | Freq: Once | INTRAMUSCULAR | Status: DC | PRN

## 2015-02-16 MED ORDER — SODIUM CHLORIDE 0.9 % IR SOLN
Status: DC | PRN
  Administered 2015-02-16: 1000 mL

## 2015-02-16 MED ORDER — VANCOMYCIN HCL 10 G IV SOLR
1500.0000 mg | INTRAVENOUS | Status: AC
  Administered 2015-02-16: 1500 mg via INTRAVENOUS
  Filled 2015-02-16: qty 1500

## 2015-02-16 MED ORDER — CHLORHEXIDINE GLUCONATE 4 % EX LIQD
1.0000 | Freq: Once | CUTANEOUS | Status: DC
Start: 2015-02-16 — End: 2015-02-16

## 2015-02-16 MED ORDER — FENTANYL CITRATE 0.05 MG/ML IJ SOLN
INTRAMUSCULAR | Status: DC | PRN
  Administered 2015-02-16: 25 ug via INTRAVENOUS
  Administered 2015-02-16: 50 ug via INTRAVENOUS
  Administered 2015-02-16: 25 ug via INTRAVENOUS
  Administered 2015-02-16 (×2): 50 ug via INTRAVENOUS
  Administered 2015-02-16 (×2): 25 ug via INTRAVENOUS
  Administered 2015-02-16: 50 ug via INTRAVENOUS
  Administered 2015-02-16: 25 ug via INTRAVENOUS

## 2015-02-16 MED ORDER — FENTANYL CITRATE 0.05 MG/ML IJ SOLN: 25.0000 ug | INTRAMUSCULAR | Status: DC | PRN

## 2015-02-16 MED ORDER — FENTANYL CITRATE 0.05 MG/ML IJ SOLN
INTRAMUSCULAR | Status: AC
  Filled 2015-02-16: qty 2

## 2015-02-16 MED ORDER — BUPIVACAINE LIPOSOME 1.3 % IJ SUSP
INTRAMUSCULAR | Status: AC
  Filled 2015-02-16: qty 20

## 2015-02-16 MED ORDER — ONDANSETRON HCL 4 MG/2ML IJ SOLN
4.0000 mg | Freq: Once | INTRAMUSCULAR | Status: AC
  Administered 2015-02-16: 4 mg via INTRAVENOUS

## 2015-02-16 MED ORDER — SCOPOLAMINE 1 MG/3DAYS TD PT72
1.0000 | MEDICATED_PATCH | Freq: Once | TRANSDERMAL | Status: DC
  Administered 2015-02-16: 1.5 mg via TRANSDERMAL

## 2015-02-16 MED ORDER — MIDAZOLAM HCL 2 MG/2ML IJ SOLN
1.0000 mg | INTRAMUSCULAR | Status: DC | PRN
  Administered 2015-02-16: 2 mg via INTRAVENOUS

## 2015-02-16 MED ORDER — PROPOFOL 10 MG/ML IV BOLUS
INTRAVENOUS | Status: DC | PRN
  Administered 2015-02-16: 100 mg via INTRAVENOUS
  Administered 2015-02-16: 200 mg via INTRAVENOUS

## 2015-02-16 MED ORDER — GLYCOPYRROLATE 0.2 MG/ML IJ SOLN
0.2000 mg | Freq: Once | INTRAMUSCULAR | Status: AC
  Administered 2015-02-16: 0.2 mg via INTRAVENOUS

## 2015-02-16 SURGICAL SUPPLY — 40 items
BAG HAMPER (MISCELLANEOUS) ×3 IMPLANT
BLADE 10 SAFETY STRL DISP (BLADE) IMPLANT
BLADE SURG SZ10 CARB STEEL (BLADE) ×3 IMPLANT
CLOTH BEACON ORANGE TIMEOUT ST (SAFETY) ×3 IMPLANT
COVER LIGHT HANDLE STERIS (MISCELLANEOUS) ×6 IMPLANT
DECANTER SPIKE VIAL GLASS SM (MISCELLANEOUS) ×3 IMPLANT
DRAIN PENROSE 18X1/2 LTX STRL (DRAIN) ×3 IMPLANT
ELECT REM PT RETURN 9FT ADLT (ELECTROSURGICAL) ×3
ELECTRODE REM PT RTRN 9FT ADLT (ELECTROSURGICAL) ×1 IMPLANT
FORMALIN 10 PREFIL 120ML (MISCELLANEOUS) IMPLANT
GLOVE BIOGEL M 7.0 STRL (GLOVE) ×6 IMPLANT
GLOVE BIOGEL PI IND STRL 7.0 (GLOVE) ×2 IMPLANT
GLOVE BIOGEL PI INDICATOR 7.0 (GLOVE) ×4
GLOVE EXAM NITRILE LRG STRL (GLOVE) ×3 IMPLANT
GLOVE SURG SS PI 7.5 STRL IVOR (GLOVE) ×3 IMPLANT
GOWN STRL REUS W/ TWL XL LVL3 (GOWN DISPOSABLE) ×1 IMPLANT
GOWN STRL REUS W/TWL LRG LVL3 (GOWN DISPOSABLE) ×6 IMPLANT
GOWN STRL REUS W/TWL XL LVL3 (GOWN DISPOSABLE) ×2
INST SET MINOR GENERAL (KITS) ×3 IMPLANT
KIT ROOM TURNOVER APOR (KITS) ×3 IMPLANT
LIQUID BAND (GAUZE/BANDAGES/DRESSINGS) ×3 IMPLANT
MANIFOLD NEPTUNE II (INSTRUMENTS) ×3 IMPLANT
MESH HERNIA 1.6X1.9 PLUG LRG (Mesh General) ×1 IMPLANT
MESH HERNIA PLUG LRG (Mesh General) ×2 IMPLANT
NEEDLE HYPO 21X1.5 SAFETY (NEEDLE) ×3 IMPLANT
NS IRRIG 1000ML POUR BTL (IV SOLUTION) ×3 IMPLANT
PACK MINOR (CUSTOM PROCEDURE TRAY) ×3 IMPLANT
PAD ARMBOARD 7.5X6 YLW CONV (MISCELLANEOUS) ×3 IMPLANT
SCRUB PCMX 4 OZ (MISCELLANEOUS) ×3 IMPLANT
SET BASIN LINEN APH (SET/KITS/TRAYS/PACK) ×3 IMPLANT
SUT NOVAFIL NAB HGS22 2-0 30IN (SUTURE) ×9 IMPLANT
SUT SILK 3 0 (SUTURE)
SUT SILK 3-0 18XBRD TIE 12 (SUTURE) IMPLANT
SUT VIC AB 2-0 CT1 27 (SUTURE) ×2
SUT VIC AB 2-0 CT1 TAPERPNT 27 (SUTURE) ×1 IMPLANT
SUT VIC AB 3-0 SH 27 (SUTURE) ×2
SUT VIC AB 3-0 SH 27X BRD (SUTURE) ×1 IMPLANT
SUT VIC AB 4-0 PS2 27 (SUTURE) ×3 IMPLANT
SUT VICRYL AB 3 0 TIES (SUTURE) IMPLANT
SYR 20CC LL (SYRINGE) ×3 IMPLANT

## 2015-02-16 NOTE — Transfer of Care (Signed)
Immediate Anesthesia Transfer of Care Note  Patient: Seth [name]  Procedure(s) Performed: Procedure(s): LEFT INGUINAL HERNIORRHAPHY (Left) INSERTION OF MESH (Left)  Patient Location: PACU  Anesthesia Type:General  Level of Consciousness: awake, alert  and oriented  Airway & Oxygen Therapy: Patient Spontanous Breathing and Patient connected to face mask oxygen  Post-op Assessment: Report given to RN  Post vital signs: Reviewed and stable  Last Vitals:  Filed Vitals:   02/16/15 0839  BP:   Pulse:   Temp: 36.4 C  Resp:     Complications: No apparent anesthesia complications

## 2015-02-16 NOTE — Interval H&P Note (Signed)
History and Physical Interval Note:  02/16/2015 7:13 AM  Seth [name]  has presented today for surgery, with the diagnosis of inguinal hernia  The various methods of treatment have been discussed with the patient and family. After consideration of risks, benefits and other options for treatment, the patient has consented to  Procedure(s): HERNIA REPAIR INGUINAL ADULT WITH MESH (Left) as a surgical intervention .  The patient's history has been reviewed, patient examined, no change in status, stable for surgery.  I have reviewed the patient's chart and labs.  Questions were answered to the patient's satisfaction.     Breaunna Gottlieb A   

## 2015-02-16 NOTE — Anesthesia Preprocedure Evaluation (Signed)
Anesthesia Evaluation  Patient identified by MRN, date of birth, ID band Patient awake    Reviewed: Allergy & Precautions, NPO status , Patient's Chart, lab work & pertinent test results  History of Anesthesia Complications (+) PONV and history of anesthetic complications  Airway Mallampati: I  TM Distance: >3 FB     Dental  (+) Teeth Intact, Poor Dentition, Dental Advisory Given   Pulmonary Current Smoker (am cough), former smoker,  breath sounds clear to auscultation        Cardiovascular negative cardio ROS  Rhythm:Regular Rate:Normal     Neuro/Psych    GI/Hepatic negative GI ROS,   Endo/Other    Renal/GU      Musculoskeletal   Abdominal   Peds  Hematology   Anesthesia Other Findings   Reproductive/Obstetrics                             Anesthesia Physical Anesthesia Plan  ASA: II  Anesthesia Plan: General   Post-op Pain Management:    Induction: Intravenous  Airway Management Planned: LMA  Additional Equipment:   Intra-op Plan:   Post-operative Plan: Extubation in OR  Informed Consent: I have reviewed the patients History and Physical, chart, labs and discussed the procedure including the risks, benefits and alternatives for the proposed anesthesia with the patient or authorized representative who has indicated his/her understanding and acceptance.     Plan Discussed with:   Anesthesia Plan Comments:         Anesthesia Quick Evaluation

## 2015-02-16 NOTE — Anesthesia Procedure Notes (Signed)
Procedure Name: LMA Insertion Date/Time: 02/16/2015 7:38 AM Performed by: Glynn OctaveANIEL, Shannyn Jankowiak E Pre-anesthesia Checklist: Patient identified, Patient being monitored, Emergency Drugs available, Timeout performed and Suction available Patient Re-evaluated:Patient Re-evaluated prior to inductionOxygen Delivery Method: Circle System Utilized Preoxygenation: Pre-oxygenation with 100% oxygen Intubation Type: IV induction Ventilation: Mask ventilation without difficulty LMA: LMA inserted LMA Size: 5.0 Number of attempts: 1 Placement Confirmation: positive ETCO2 and breath sounds checked- equal and bilateral

## 2015-02-16 NOTE — Op Note (Signed)
Patient:  Seth [name]  DOB:  Jun 10, 1961  MRN:  [id number]   Preop Diagnosis:  Left inguinal hernia  Postop Diagnosis:  Same  Procedure:  Left inguinal herniorrhaphy with mesh  Surgeon:  Franky MachoMark Aliannah Holstrom, M.D.  Anes:  Gen.  Indications:  Patient is a 54 year old white male who presents with a symptomatic left inguinal hernia. He sustained this while at work. The risks and benefits of the procedure including bleeding, infection, mesh use, and the possibility of recurrence of the hernia were fully explained to the patient, who gave informed consent.  Procedure note:  The patient was placed the supine position. After general anesthesia was administered, the left groin region was prepped and draped using usual sterile technique with CBG. Surgical site confirmation was performed.  An incision was made in the left groin region down to the external oblique aponeuroses. The aponeuroses was incised to the external ring. A Penrose drain was placed around the spermatic cord. The vase deferens was noted within the spermatic cord. The ilioinguinal nerve was identified and retracted inferiorly from the operative field. The patient had a direct hernia sac. This was incised at its base and reduced. A large Bard PerFix plug was then inserted into this area and secured to the transversalis fascia using 2-0 Novafil interrupted sutures. An onlay patch was then placed along the floor of the inguinal canal and secured superiorly to the conjoined tendon and inferiorly to the shelving edge of Poupart's ligament using 2-0 Novafil interrupted sutures. The internal ring was re-created using a 2-0 Novafil interrupted suture. The external oblique aponeuroses was reapproximated using a 2-0 Vicryl running suture. The subcutaneous layer was reapproximated using 3-0 Vicryl interrupted sutures. The skin was closed using a 4-0 Vicryl subcuticular suture. Exparel was instilled the surrounding wound. Lithobid was then  applied.  All tape and needle counts were correct at the end of the procedure. The patient was awakened and transferred to PACU in stable condition.  Complications:  None  EBL:  Minimal  Specimen:  None

## 2015-02-16 NOTE — Anesthesia Postprocedure Evaluation (Signed)
  Anesthesia Post-op Note  Patient: Seth [name]  Procedure(s) Performed: Procedure(s): LEFT INGUINAL HERNIORRHAPHY (Left) INSERTION OF MESH (Left)  Patient Location: PACU  Anesthesia Type:General  Level of Consciousness: awake, alert  and oriented  Airway and Oxygen Therapy: Patient Spontanous Breathing  Post-op Pain: none  Post-op Assessment: Post-op Vital signs reviewed  Post-op Vital Signs: Reviewed and stable  Last Vitals:  Filed Vitals:   02/16/15 0839  BP: 119/80  Pulse: 74  Temp: 36.4 C  Resp: 20    Complications: No apparent anesthesia complications

## 2015-02-16 NOTE — Interval H&P Note (Signed)
History and Physical Interval Note:  02/16/2015 7:13 AM  Seth [name]  has presented today for surgery, with the diagnosis of inguinal hernia  The various methods of treatment have been discussed with the patient and family. After consideration of risks, benefits and other options for treatment, the patient has consented to  Procedure(s): HERNIA REPAIR INGUINAL ADULT WITH MESH (Left) as [name] surgical intervention .  The patient's history has been reviewed, patient examined, no change in status, stable for surgery.  I have reviewed the patient's chart and labs.  Questions were answered to the patient's satisfaction.     Franky MachoJENKINS,Seth [name]

## 2015-02-16 NOTE — Discharge Instructions (Signed)
Inguinal Hernia, Adult  °Care After °Refer to this sheet in the next few weeks. These discharge instructions provide you with general information on caring for yourself after you leave the hospital. Your caregiver may also give you specific instructions. Your treatment has been planned according to the most current medical practices available, but unavoidable complications sometimes occur. If you have any problems or questions after discharge, please call your caregiver. °HOME CARE INSTRUCTIONS °· Put ice on the operative site. °¨ Put ice in a plastic bag. °¨ Place a towel between your skin and the bag. °¨ Leave the ice on for 15-20 minutes at a time, 03-04 times a day while awake. °· Change bandages (dressings) as directed. °· Keep the wound dry and clean. The wound may be washed gently with soap and water. Gently blot or dab the wound dry. It is okay to take showers 24 to 48 hours after surgery. Do not take baths, use swimming pools, or use hot tubs for 10 days, or as directed by your caregiver. °· Only take over-the-counter or prescription medicines for pain, discomfort, or fever as directed by your caregiver. °· Continue your normal diet as directed. °· Do not lift anything more than 10 pounds or play contact sports for 3 weeks, or as directed. °SEEK MEDICAL CARE IF: °· There is redness, swelling, or increasing pain in the wound. °· There is fluid (pus) coming from the wound. °· There is drainage from a wound lasting longer than 1 day. °· You have an oral temperature above 102° F (38.9° C). °· You notice a bad smell coming from the wound or dressing. °· The wound breaks open after the stitches (sutures) have been removed. °· You notice increasing pain in the shoulders (shoulder strap areas). °· You develop dizzy episodes or fainting while standing. °· You feel sick to your stomach (nauseous) or throw up (vomit). °SEEK IMMEDIATE MEDICAL CARE IF: °· You develop a rash. °· You have difficulty breathing. °· You  develop a reaction or have side effects to medicines you were given. °MAKE SURE YOU:  °· Understand these instructions. °· Will watch your condition. °· Will get help right away if you are not doing well or get worse. °Document Released: 01/08/2007 Document Revised: 03/01/2012 Document Reviewed: 11/07/2009 °ExitCare® Patient Information ©2015 [employer]. This information is not intended to replace advice given to you by your health care provider. Make sure you discuss any questions you have with your health care provider. ° °

## 2015-02-19 ENCOUNTER — Encounter (HOSPITAL_COMMUNITY): Payer: Self-pay | Admitting: General Surgery

## 2015-03-16 ENCOUNTER — Encounter (HOSPITAL_COMMUNITY): Payer: Self-pay | Admitting: Emergency Medicine

## 2015-03-16 ENCOUNTER — Emergency Department (HOSPITAL_COMMUNITY)
Admission: EM | Admit: 2015-03-16 | Discharge: 2015-03-16 | Disposition: A | Discharge status: Home / Self Care | Payer: BLUE CROSS/BLUE SHIELD | Attending: Emergency Medicine | Admitting: Emergency Medicine

## 2015-03-16 DIAGNOSIS — Z720 Tobacco use: Secondary | ICD-10-CM | POA: Diagnosis not present

## 2015-03-16 DIAGNOSIS — N400 Enlarged prostate without lower urinary tract symptoms: Secondary | ICD-10-CM | POA: Diagnosis not present

## 2015-03-16 DIAGNOSIS — Z7982 Long term (current) use of aspirin: Secondary | ICD-10-CM | POA: Insufficient documentation

## 2015-03-16 DIAGNOSIS — N390 Urinary tract infection, site not specified: Secondary | ICD-10-CM | POA: Diagnosis not present

## 2015-03-16 DIAGNOSIS — Z791 Long term (current) use of non-steroidal anti-inflammatories (NSAID): Secondary | ICD-10-CM | POA: Insufficient documentation

## 2015-03-16 DIAGNOSIS — R319 Hematuria, unspecified: Secondary | ICD-10-CM | POA: Diagnosis not present

## 2015-03-16 DIAGNOSIS — Z880 Allergy status to penicillin: Secondary | ICD-10-CM | POA: Diagnosis not present

## 2015-03-16 LAB — URINALYSIS, ROUTINE W REFLEX MICROSCOPIC
Glucose, UA: NEGATIVE mg/dL
Nitrite: POSITIVE — AB
PROTEIN: 100 mg/dL — AB
SPECIFIC GRAVITY, URINE: 1.025 (ref 1.005–1.030)
Urobilinogen, UA: 1 mg/dL (ref 0.0–1.0)
pH: 6.5 (ref 5.0–8.0)

## 2015-03-16 LAB — URINE MICROSCOPIC-ADD ON

## 2015-03-16 LAB — POC OCCULT BLOOD, ED: FECAL OCCULT BLD: NEGATIVE

## 2015-03-16 MED ORDER — CIPROFLOXACIN HCL 500 MG PO TABS: 500.0000 mg | ORAL_TABLET | Freq: Two times a day (BID) | ORAL | Status: DC

## 2015-03-16 MED ORDER — CIPROFLOXACIN HCL 250 MG PO TABS
500.0000 mg | ORAL_TABLET | Freq: Once | ORAL | Status: AC
  Administered 2015-03-16: 500 mg via ORAL
  Filled 2015-03-16: qty 2

## 2015-03-16 MED ORDER — TAMSULOSIN HCL 0.4 MG PO CAPS: 0.4000 mg | ORAL_CAPSULE | Freq: Every day | ORAL | Status: DC

## 2015-03-16 NOTE — Discharge Instructions (Signed)
Urinary Tract Infection °A urinary tract infection (UTI) can occur any place along the urinary tract. The tract includes the kidneys, ureters, bladder, and urethra. A type of germ called bacteria often causes a UTI. UTIs are often helped with antibiotic medicine.  °HOME CARE  °· If given, take antibiotics as told by your doctor. Finish them even if you start to feel better. °· Drink enough fluids to keep your pee (urine) clear or pale yellow. °· Avoid tea, drinks with caffeine, and bubbly (carbonated) drinks. °· Pee often. Avoid holding your pee in for a long time. °· Pee before and after having sex (intercourse). °· Wipe from front to back after you poop (bowel movement) if you are a woman. Use each tissue only once. °GET HELP RIGHT AWAY IF:  °· You have back pain. °· You have lower belly (abdominal) pain. °· You have chills. °· You feel sick to your stomach (nauseous). °· You throw up (vomit). °· Your burning or discomfort with peeing does not go away. °· You have a fever. °· Your symptoms are not better in 3 days. °MAKE SURE YOU:  °· Understand these instructions. °· Will watch your condition. °· Will get help right away if you are not doing well or get worse. °Document Released: 05/26/2008 Document Revised: 09/01/2012 Document Reviewed: 07/08/2012 °ExitCare® Patient Information ©2015 [employer]. This information is not intended to replace advice given to you by your health care provider. Make sure you discuss any questions you have with your health care provider. °Hematuria °Hematuria is blood in your urine. It can be caused by a bladder infection, kidney infection, prostate infection, kidney stone, or cancer of your urinary tract. Infections can usually be treated with medicine, and a kidney stone usually will pass through your urine. If neither of these is the cause of your hematuria, further workup to find out the reason may be needed. °It is very important that you tell your health care provider about any  blood you see in your urine, even if the blood stops without treatment or happens without causing pain. Blood in your urine that happens and then stops and then happens again can be a symptom of a very serious condition. Also, pain is not a symptom in the initial stages of many urinary cancers. °HOME CARE INSTRUCTIONS  °· Drink lots of fluid, 3-4 quarts a day. If you have been diagnosed with an infection, cranberry juice is especially recommended, in addition to large amounts of water. °· Avoid caffeine, tea, and carbonated beverages because they tend to irritate the bladder. °· Avoid alcohol because it may irritate the prostate. °· Take all medicines as directed by your health care provider. °· If you were prescribed an antibiotic medicine, finish it all even if you start to feel better. °· If you have been diagnosed with a kidney stone, follow your health care provider's instructions regarding straining your urine to catch the stone. °· Empty your bladder often. Avoid holding urine for long periods of time. °· After a bowel movement, women should cleanse front to back. Use each tissue only once. °· Empty your bladder before and after sexual intercourse if you are a male. °SEEK MEDICAL CARE IF: °· You develop back pain. °· You have a fever. °· You have a feeling of sickness in your stomach (nausea) or vomiting. °· Your symptoms are not better in 3 days. Return sooner if you are getting worse. °SEEK IMMEDIATE MEDICAL CARE IF:  °· You develop severe vomiting and   and are unable to keep the medicine down.  You develop severe back or abdominal pain despite taking your medicines.  You begin passing a large amount of blood or clots in your urine.  You feel extremely weak or faint, or you pass out. MAKE SURE YOU:   Understand these instructions.  Will watch your condition.  Will get help right away if you are not doing well or get worse. Document Released: 12/08/2005 Document Revised: 04/24/2014 Document  Reviewed: 08/08/2013 [facility] Patient Information 2015 [place], [place]. This information is not intended to replace advice given to you by your health care provider. Make sure you discuss any questions you have with your health care provider.

## 2015-03-16 NOTE — ED Notes (Signed)
PT c/o increased urinary frequency and burning x1 week. PT also c/o some blood in urine noted this am.

## 2015-03-16 NOTE — ED Provider Notes (Signed)
CSN: [id number]     Arrival date & time 03/16/15  1009 History   First MD Initiated Contact with Patient 03/16/15 1014     Chief Complaint  Patient presents with  . Urinary Tract Infection     (Consider location/radiation/quality/duration/timing/severity/associated sxs/prior Treatment) HPI Comments: Patient states that 2 weeks ago he noted tics on his body, he removed them. After that he noted some chills and body aches. He took some Keflex because he thought he may have "tick fever". This week he has noted increasing urinary frequency and some burning. Today the patient noted some blood in his urine and came to the emergency department. He's not had any measured temperature elevation. He's not had any unusual back pain. There's been no nausea or vomiting. His been no injury to the back, bladder area, or genitalia.  Patient is a 54 y.o. male presenting with hematuria. The history is provided by the patient.  Hematuria This is a new problem. The current episode started today. The problem occurs intermittently. The problem has been unchanged. Associated symptoms include chills, fatigue and urinary symptoms. Pertinent negatives include no fever. Nothing aggravates the symptoms. Treatments tried: Pt states he took a few doses of keflex.    Past Medical History  Diagnosis Date  . PONV (postoperative nausea and vomiting)    Past Surgical History  Procedure Laterality Date  . Tonsillectomy    . Hernia repair Right   . Inguinal hernia repair Left 02/16/2015    Procedure: LEFT INGUINAL HERNIORRHAPHY;  Surgeon: Dalia Heading, MD;  Location: [facility];  Service: General;  Laterality: Left;  . Insertion of mesh Left 02/16/2015    Procedure: INSERTION OF MESH;  Surgeon: Dalia Heading, MD;  Location: [facility];  Service: General;  Laterality: Left;   Family History  Problem Relation Age of Onset  . Cancer Mother   . Cancer Father   . Anuerysm Sister   . Anuerysm Brother    History  Substance Use  Topics  . Smoking status: Current Every Day Smoker -- 1.00 packs/day for 25 years    Types: Cigarettes  . Smokeless tobacco: Former Neurosurgeon     Comment: 1 pk a day.  . Alcohol Use: No    Review of Systems  Constitutional: Positive for chills and fatigue. Negative for fever.  Genitourinary: Positive for hematuria.  All other systems reviewed and are negative.     Allergies  Penicillins  Home Medications   Prior to Admission medications   Medication Sig Start Date End Date Taking? Authorizing Provider  aspirin 325 MG tablet Take 325 mg by mouth every 6 (six) hours as needed. For chest pain    Yes Historical Provider, MD  fish oil-omega-3 fatty acids 1000 MG capsule Take 1 g by mouth daily.     Yes Historical Provider, MD  ibuprofen (ADVIL,MOTRIN) 200 MG tablet Take 800 mg by mouth every 6 (six) hours as needed. For pain    Yes Historical Provider, MD  Multiple Vitamin (MULTIVITAMIN WITH MINERALS) TABS tablet Take 1 tablet by mouth daily.   Yes Historical Provider, MD  naproxen (NAPROSYN) 500 MG tablet Take 500 mg by mouth 2 (two) times daily as needed for mild pain.   Yes Historical Provider, MD  oxyCODONE-acetaminophen (PERCOCET) 7.5-325 MG per tablet Take 1-2 tablets by mouth every 4 (four) hours as needed. Patient not taking: Reported on 03/16/2015 02/16/15   Franky Macho Md, MD   BP 157/89 mmHg  Pulse 98  Temp(Src) 98.3  F (36.8 C) (Oral)  Resp 18  Ht 6\' 3"  (1.905 m)  Wt 240 lb (108.863 kg)  BMI 30.00 kg/m2  SpO2 97% Physical Exam  Constitutional: He is oriented to person, place, and time. He appears well-developed and well-nourished.  Non-toxic appearance.  HENT:  Head: Normocephalic.  Right Ear: Tympanic membrane and external ear normal.  Left Ear: Tympanic membrane and external ear normal.  Eyes: EOM and lids are normal. Pupils are equal, round, and reactive to light.  Neck: Normal range of motion. Neck supple. Carotid bruit is not present.  Cardiovascular: Normal  rate, regular rhythm, normal heart sounds, intact distal pulses and normal pulses.   Pulmonary/Chest: Breath sounds normal. No respiratory distress.  Abdominal: Soft. Bowel sounds are normal. There is no tenderness. There is no guarding and no CVA tenderness.  Genitourinary: Testes normal and penis normal. Prostate is enlarged. Circumcised.  Musculoskeletal: Normal range of motion.  Lymphadenopathy:       Head (right side): No submandibular adenopathy present.       Head (left side): No submandibular adenopathy present.    He has no cervical adenopathy.  Neurological: He is alert and oriented to person, place, and time. He has normal strength. No cranial nerve deficit or sensory deficit.  Skin: Skin is warm and dry.  Psychiatric: He has a normal mood and affect. His speech is normal.  Nursing note and vitals reviewed.   ED Course  Procedures (including critical care time) Labs Review Labs Reviewed  URINALYSIS, ROUTINE W REFLEX MICROSCOPIC - Abnormal; Notable for the following:    Color, Urine RED (*)    APPearance HAZY (*)    Hgb urine dipstick LARGE (*)    Bilirubin Urine MODERATE (*)    Ketones, ur TRACE (*)    Protein, ur 100 (*)    Nitrite POSITIVE (*)    Leukocytes, UA LARGE (*)    All other components within normal limits  URINE MICROSCOPIC-ADD ON - Abnormal; Notable for the following:    Squamous Epithelial / LPF FEW (*)    Bacteria, UA MANY (*)    All other components within normal limits  POC OCCULT BLOOD, ED    Imaging Review No results found.   EKG Interpretation None      MDM  Vital signs wnl. Pulse ox 97% on room air. UA c/w UTI. Pt has enlarged prostate.Rx for cipro and flomax given to the patient. Pt to follow up with [facility] for additional prostate management.   Final diagnoses:  None    *I have reviewed nursing notes, vital signs, and all appropriate lab and imaging results for this patient.[address]    Hope Brandenburger, PA-C 03/17/15  [id number]  Mancel BaleElliott Wentz, MD 03/19/15 [phone]

## 2015-03-19 LAB — URINE CULTURE: Special Requests: NORMAL

## 2015-03-20 ENCOUNTER — Telehealth (HOSPITAL_COMMUNITY): Payer: Self-pay

## 2015-03-20 NOTE — ED Notes (Signed)
Post ED Visit - Positive Culture Follow-up  Culture report reviewed by antimicrobial stewardship pharmacist: []  Wes Dulaney, Pharm.D., BCPS [x]  Celedonio MiyamotoJeremy Frens, [address] BCPS []  Georgina PillionElizabeth Martin, [address]Ph[address] BCPS []  FlorenceMinh Pham, [address]Ph[address] BCPS, AAHIVP []  Estella HuskMichelle Turner, Pharm.D., BCPS, AAHIVP []  Elder CyphersLorie Poole, [address]Ph[address] BCPS  Positive urine culture Treated with cipro, organism sensitive to the same and no further patient follow-up is required at this time.  Ashley JacobsFesterman, Seema Blum C 03/20/2015, 10:26 AM

## 2015-06-08 ENCOUNTER — Ambulatory Visit: Payer: Self-pay | Admitting: Urology

## 2018-03-13 ENCOUNTER — Emergency Department (HOSPITAL_COMMUNITY)
Admission: EM | Admit: 2018-03-13 | Discharge: 2018-03-13 | Disposition: A | Discharge status: Home / Self Care | Payer: BLUE CROSS/BLUE SHIELD | Attending: Emergency Medicine | Admitting: Emergency Medicine

## 2018-03-13 ENCOUNTER — Encounter (HOSPITAL_COMMUNITY): Payer: Self-pay | Admitting: Cardiology

## 2018-03-13 DIAGNOSIS — R0981 Nasal congestion: Secondary | ICD-10-CM | POA: Diagnosis present

## 2018-03-13 DIAGNOSIS — R42 Dizziness and giddiness: Secondary | ICD-10-CM | POA: Diagnosis not present

## 2018-03-13 DIAGNOSIS — J019 Acute sinusitis, unspecified: Secondary | ICD-10-CM | POA: Insufficient documentation

## 2018-03-13 DIAGNOSIS — F17210 Nicotine dependence, cigarettes, uncomplicated: Secondary | ICD-10-CM | POA: Diagnosis not present

## 2018-03-13 DIAGNOSIS — Z79899 Other long term (current) drug therapy: Secondary | ICD-10-CM | POA: Diagnosis not present

## 2018-03-13 MED ORDER — AZITHROMYCIN 250 MG PO TABS: 500.0000 mg | ORAL_TABLET | Freq: Every day | ORAL | 0 refills | Status: DC

## 2018-03-13 NOTE — ED Notes (Signed)
ED Provider at bedside. 

## 2018-03-13 NOTE — ED Notes (Signed)
Instructed pt to take all of antibiotics as prescribed. 

## 2018-03-13 NOTE — Discharge Instructions (Addendum)
It is ok to continue taking your loratadine and your phenylephrine, remember that these medicines can "dry you out" and you should drink lots of fluids while taking these medicines.  Take the entire course of the antibiotics prescribed.

## 2018-03-13 NOTE — ED Triage Notes (Signed)
Cough , nasal congestion and sore throat since Tuesday.

## 2018-03-14 NOTE — ED Provider Notes (Signed)
[facility] EMERGENCY DEPARTMENT Provider Note   CSN: [id number] Arrival date & time: 03/13/18  1207     History   Chief Complaint Chief Complaint  Patient presents with  . Cough    HPI Seth [name] is a 57 y.o. male presenting with a 5 day history of uri type symptoms which includes nasal congestion with thick, bloody rhinorrhea (denies frank nose bleeds), sore throat, low grade fever and nonproductive cough in addition to facial and ear pain and pressure and transient lightheadedness with positional changes.  He denies fevers or chills and has been maintaining good PO intact including drinking plenty of fluids.  Symptoms do not include headache, neck pain or stiffness, focal weakness, shortness of breath, chest pain,  Nausea, vomiting or diarrhea.  The patient has been taking generic claritin and phenylephrine  prior to arrival with no significant improvement in symptoms.    The history is provided by the patient.    Past Medical History:  Diagnosis Date  . PONV (postoperative nausea and vomiting)     Patient Active Problem List   Diagnosis Date Noted  . Musculoskeletal pain 08/04/2011  . CHEST PAIN 03/08/2010    Past Surgical History:  Procedure Laterality Date  . HERNIA REPAIR Right   . INGUINAL HERNIA REPAIR Left 02/16/2015   Procedure: LEFT INGUINAL HERNIORRHAPHY;  Surgeon: Dalia Heading, MD;  Location: [facility];  Service: General;  Laterality: Left;  . INSERTION OF MESH Left 02/16/2015   Procedure: INSERTION OF MESH;  Surgeon: Dalia Heading, MD;  Location: [facility];  Service: General;  Laterality: Left;  . TONSILLECTOMY          Home Medications    Prior to Admission medications   Medication Sig Start Date End Date Taking? Authorizing Provider  aspirin 325 MG tablet Take 325 mg by mouth every 6 (six) hours as needed. For chest pain     [provider]  azithromycin (ZITHROMAX Z-PAK) 250 MG tablet Take 2 tablets (500 mg total) by mouth daily. 03/13/18    Burgess Amor, PA-C  ciprofloxacin (CIPRO) 500 MG tablet Take 1 tablet (500 mg total) by mouth 2 (two) times daily. 03/16/15   Ivery Quale, PA-C  fish oil-omega-3 fatty acids 1000 MG capsule Take 1 g by mouth daily.      [provider]  ibuprofen (ADVIL,MOTRIN) 200 MG tablet Take 800 mg by mouth every 6 (six) hours as needed. For pain     [provider]  Multiple Vitamin (MULTIVITAMIN WITH MINERALS) TABS tablet Take 1 tablet by mouth daily.    [provider]  naproxen (NAPROSYN) 500 MG tablet Take 500 mg by mouth 2 (two) times daily as needed for mild pain.    [provider]  oxyCODONE-acetaminophen (PERCOCET) 7.5-325 MG per tablet Take 1-2 tablets by mouth every 4 (four) hours as needed. Patient not taking: Reported on 03/16/2015 02/16/15   Franky Macho, MD  tamsulosin (FLOMAX) 0.4 MG CAPS capsule Take 1 capsule (0.4 mg total) by mouth daily. 03/16/15   Ivery Quale, PA-C    Family History Family History  Problem Relation Age of Onset  . Cancer Mother   . Cancer Father   . Anuerysm Sister   . Anuerysm Brother     Social History Social History   Tobacco Use  . Smoking status: Current Every Day Smoker    Packs/day: 1.00    Years: 25.00    Pack years: 25.00    Types: Cigarettes  .  Smokeless tobacco: Former NeurosurgeonUser  . Tobacco comment: 1 pk a day.  Substance Use Topics  . Alcohol use: No  . Drug use: Not Currently    Frequency: 0.5 times per week     Allergies   Penicillins   Review of Systems Review of Systems  Constitutional: Negative for chills and fever.  HENT: Positive for congestion, ear pain, rhinorrhea and sore throat. Negative for facial swelling, nosebleeds, sinus pressure, trouble swallowing and voice change.   Eyes: Negative for discharge.  Respiratory: Positive for cough. Negative for shortness of breath, wheezing and stridor.   Cardiovascular: Negative for chest pain.  Gastrointestinal: Negative for abdominal pain.    Genitourinary: Negative.      Physical Exam Updated Vital Signs BP (!) 127/102 (BP Location: Right Arm)   Pulse 83   Temp 98 F (36.7 C)   Resp 16   Ht 6\' 3"  (1.905 m)   Wt 104.3 kg (230 lb)   SpO2 97%   BMI 28.75 kg/m   Physical Exam  Constitutional: He is oriented to person, place, and time. He appears well-developed and well-nourished.  HENT:  Head: Normocephalic and atraumatic.  Left Ear: Tympanic membrane is retracted. Tympanic membrane is not injected.  No middle ear effusion.  Nose: Mucosal edema and rhinorrhea present. Right sinus exhibits frontal sinus tenderness. Left sinus exhibits frontal sinus tenderness.  Mouth/Throat: Uvula is midline, oropharynx is clear and moist and mucous membranes are normal. No oropharyngeal exudate, posterior oropharyngeal edema, posterior oropharyngeal erythema or tonsillar abscesses.  Bilateral copious cerumen, right completely impacted. Left canal easily cleared using curette and exam revealed TM with mild retraction. Offered right ear flushing, pt deferred.  Eyes: Conjunctivae are normal.  Neck: Normal range of motion. Neck supple.  Cardiovascular: Normal rate and normal heart sounds.  Pulmonary/Chest: Effort normal. No respiratory distress. He has no wheezes. He has no rales.  Musculoskeletal: Normal range of motion.  Neurological: He is alert and oriented to person, place, and time.  Skin: Skin is warm and dry. No rash noted.  Psychiatric: He has a normal mood and affect.     ED Treatments / Results  Labs (all labs ordered are listed, but only abnormal results are displayed) Labs Reviewed - No data to display  EKG None  Radiology No results found.  Procedures Procedures (including critical care time)  Medications Ordered in ED Medications - No data to display   Initial Impression / Assessment and Plan / ED Course  I have reviewed the triage vital signs and the nursing notes.  Pertinent labs & imaging results that  were available during my care of the patient were reviewed by me and considered in my medical decision making (see chart for details).     Orthostatic vs obtained and normal.  Pt has no neuro deficits on exam.  I suspect his transient lightheadedness may be SE of his phenylephrine.  Advised to use cautiously, continue with claritin,  Discussed home tx for sinus congestion relief, started z pack. Advised prn f/u with pcp if sx persist or worsen.  Final Clinical Impressions(s) / ED Diagnoses   Final diagnoses:  Acute sinusitis, recurrence not specified, unspecified location    ED Discharge Orders        Ordered    azithromycin (ZITHROMAX Z-PAK) 250 MG tablet  Daily     03/13/18 1306       Burgess Amordol, Faithann Natal, PA-C 03/14/18 [id number]    Eber HongMiller, Brian, MD 03/15/18 2110

## 2023-06-27 ENCOUNTER — Emergency Department (HOSPITAL_COMMUNITY): Payer: Medicaid Other

## 2023-06-27 ENCOUNTER — Inpatient Hospital Stay (HOSPITAL_COMMUNITY)
Admission: EM | Admit: 2023-06-27 | Discharge: 2023-06-30 | DRG: 321 | Disposition: A | Discharge status: Home / Self Care | Payer: Medicaid Other | Attending: Cardiovascular Disease | Admitting: Cardiovascular Disease

## 2023-06-27 ENCOUNTER — Encounter (HOSPITAL_COMMUNITY): Payer: Self-pay

## 2023-06-27 ENCOUNTER — Other Ambulatory Visit: Payer: Self-pay

## 2023-06-27 DIAGNOSIS — K219 Gastro-esophageal reflux disease without esophagitis: Secondary | ICD-10-CM | POA: Diagnosis present

## 2023-06-27 DIAGNOSIS — N400 Enlarged prostate without lower urinary tract symptoms: Secondary | ICD-10-CM | POA: Diagnosis present

## 2023-06-27 DIAGNOSIS — I5021 Acute systolic (congestive) heart failure: Secondary | ICD-10-CM | POA: Diagnosis present

## 2023-06-27 DIAGNOSIS — E1165 Type 2 diabetes mellitus with hyperglycemia: Secondary | ICD-10-CM | POA: Diagnosis present

## 2023-06-27 DIAGNOSIS — I2510 Atherosclerotic heart disease of native coronary artery without angina pectoris: Secondary | ICD-10-CM | POA: Diagnosis present

## 2023-06-27 DIAGNOSIS — I2109 ST elevation (STEMI) myocardial infarction involving other coronary artery of anterior wall: Principal | ICD-10-CM | POA: Diagnosis present

## 2023-06-27 DIAGNOSIS — Z716 Tobacco abuse counseling: Secondary | ICD-10-CM | POA: Diagnosis not present

## 2023-06-27 DIAGNOSIS — N289 Disorder of kidney and ureter, unspecified: Secondary | ICD-10-CM | POA: Diagnosis present

## 2023-06-27 DIAGNOSIS — I255 Ischemic cardiomyopathy: Secondary | ICD-10-CM | POA: Diagnosis not present

## 2023-06-27 DIAGNOSIS — Z880 Allergy status to penicillin: Secondary | ICD-10-CM | POA: Diagnosis not present

## 2023-06-27 DIAGNOSIS — Z79899 Other long term (current) drug therapy: Secondary | ICD-10-CM | POA: Diagnosis not present

## 2023-06-27 DIAGNOSIS — I2102 ST elevation (STEMI) myocardial infarction involving left anterior descending coronary artery: Secondary | ICD-10-CM | POA: Diagnosis not present

## 2023-06-27 DIAGNOSIS — I10 Essential (primary) hypertension: Secondary | ICD-10-CM | POA: Diagnosis not present

## 2023-06-27 DIAGNOSIS — Z720 Tobacco use: Secondary | ICD-10-CM | POA: Diagnosis not present

## 2023-06-27 DIAGNOSIS — Z955 Presence of coronary angioplasty implant and graft: Secondary | ICD-10-CM

## 2023-06-27 DIAGNOSIS — E785 Hyperlipidemia, unspecified: Secondary | ICD-10-CM | POA: Diagnosis present

## 2023-06-27 DIAGNOSIS — I110 Hypertensive heart disease with heart failure: Secondary | ICD-10-CM | POA: Diagnosis present

## 2023-06-27 DIAGNOSIS — F17210 Nicotine dependence, cigarettes, uncomplicated: Secondary | ICD-10-CM | POA: Diagnosis present

## 2023-06-27 DIAGNOSIS — I214 Non-ST elevation (NSTEMI) myocardial infarction: Secondary | ICD-10-CM | POA: Diagnosis not present

## 2023-06-27 DIAGNOSIS — R079 Chest pain, unspecified: Secondary | ICD-10-CM | POA: Diagnosis not present

## 2023-06-27 DIAGNOSIS — E118 Type 2 diabetes mellitus with unspecified complications: Secondary | ICD-10-CM | POA: Insufficient documentation

## 2023-06-27 LAB — CBC
HCT: 44.1 % (ref 39.0–52.0)
Hemoglobin: 14.8 g/dL (ref 13.0–17.0)
MCH: 29.1 pg (ref 26.0–34.0)
MCHC: 33.6 g/dL (ref 30.0–36.0)
MCV: 86.8 fL (ref 80.0–100.0)
Platelets: 313 K/uL (ref 150–400)
RBC: 5.08 MIL/uL (ref 4.22–5.81)
RDW: 14.5 % (ref 11.5–15.5)
WBC: 11.7 K/uL — ABNORMAL HIGH (ref 4.0–10.5)
nRBC: 0 % (ref 0.0–0.2)

## 2023-06-27 LAB — GLUCOSE, CAPILLARY: Glucose-Capillary: 155 mg/dL — ABNORMAL HIGH (ref 70–99)

## 2023-06-27 LAB — MAGNESIUM: Magnesium: 2.2 mg/dL (ref 1.7–2.4)

## 2023-06-27 LAB — TROPONIN I (HIGH SENSITIVITY)
Troponin I (High Sensitivity): 242 ng/L (ref <18)
Troponin I (High Sensitivity): 1112 ng/L

## 2023-06-27 LAB — BASIC METABOLIC PANEL WITH GFR
Anion gap: 9 (ref 5–15)
BUN: 10 mg/dL (ref 8–23)
CO2: 22 mmol/L (ref 22–32)
Calcium: 8.8 mg/dL — ABNORMAL LOW (ref 8.9–10.3)
Chloride: 105 mmol/L (ref 98–111)
Creatinine, Ser: 0.69 mg/dL (ref 0.61–1.24)
GFR, Estimated: >60 mL/min
Glucose, Bld: 181 mg/dL — ABNORMAL HIGH (ref 70–99)
Potassium: 3.6 mmol/L (ref 3.5–5.1)
Sodium: 136 mmol/L (ref 135–145)

## 2023-06-27 LAB — HEMOGLOBIN A1C
Hgb A1c MFr Bld: 6.8 % — ABNORMAL HIGH (ref 4.8–5.6)
Mean Plasma Glucose: 148.46 mg/dL

## 2023-06-27 LAB — HEPARIN LEVEL (UNFRACTIONATED): Heparin Unfractionated: 0.14 IU/mL — ABNORMAL LOW (ref 0.30–0.70)

## 2023-06-27 LAB — TSH: TSH: 0.512 u[IU]/mL (ref 0.350–4.500)

## 2023-06-27 MED ORDER — NICOTINE 14 MG/24HR TD PT24
14.0000 mg | MEDICATED_PATCH | Freq: Every day | TRANSDERMAL | Status: DC
  Administered 2023-06-27 – 2023-06-30 (×4): 14 mg via TRANSDERMAL
  Filled 2023-06-27 (×4): qty 1

## 2023-06-27 MED ORDER — CLOPIDOGREL BISULFATE 75 MG PO TABS: 75.0000 mg | ORAL_TABLET | Freq: Every day | ORAL | Status: DC

## 2023-06-27 MED ORDER — CARVEDILOL 3.125 MG PO TABS
3.1250 mg | ORAL_TABLET | Freq: Two times a day (BID) | ORAL | Status: DC
  Administered 2023-06-27 – 2023-06-28 (×2): 3.125 mg via ORAL
  Filled 2023-06-27 (×3): qty 1

## 2023-06-27 MED ORDER — NITROGLYCERIN 0.4 MG SL SUBL: 0.4000 mg | SUBLINGUAL_TABLET | SUBLINGUAL | Status: DC | PRN

## 2023-06-27 MED ORDER — TAMSULOSIN HCL 0.4 MG PO CAPS
0.4000 mg | ORAL_CAPSULE | Freq: Every day | ORAL | Status: DC
  Administered 2023-06-27 – 2023-06-30 (×4): 0.4 mg via ORAL
  Filled 2023-06-27 (×4): qty 1

## 2023-06-27 MED ORDER — MORPHINE SULFATE (PF) 2 MG/ML IV SOLN
1.0000 mg | INTRAVENOUS | Status: DC | PRN
  Administered 2023-06-28: 2 mg via INTRAVENOUS
  Administered 2023-06-28: 1 mg via INTRAVENOUS
  Administered 2023-06-28 – 2023-06-29 (×2): 2 mg via INTRAVENOUS
  Filled 2023-06-27 (×4): qty 1

## 2023-06-27 MED ORDER — NITROGLYCERIN IN D5W 200-5 MCG/ML-% IV SOLN
5.0000 ug/min | INTRAVENOUS | Status: DC
  Administered 2023-06-27: 5 ug/min via INTRAVENOUS
  Administered 2023-06-28: 15 ug/min via INTRAVENOUS
  Filled 2023-06-27 (×2): qty 250

## 2023-06-27 MED ORDER — ACETAMINOPHEN 325 MG PO TABS
650.0000 mg | ORAL_TABLET | ORAL | Status: DC | PRN
  Administered 2023-06-27 (×2): 650 mg via ORAL
  Filled 2023-06-27 (×2): qty 2

## 2023-06-27 MED ORDER — ATORVASTATIN CALCIUM 40 MG PO TABS
40.0000 mg | ORAL_TABLET | Freq: Every day | ORAL | Status: DC
  Administered 2023-06-27 – 2023-06-30 (×4): 40 mg via ORAL
  Filled 2023-06-27 (×4): qty 1

## 2023-06-27 MED ORDER — SODIUM CHLORIDE 0.9% FLUSH: 3.0000 mL | INTRAVENOUS | Status: DC | PRN

## 2023-06-27 MED ORDER — HEPARIN BOLUS VIA INFUSION
4000.0000 [IU] | Freq: Once | INTRAVENOUS | Status: AC
  Administered 2023-06-27: 4000 [IU] via INTRAVENOUS

## 2023-06-27 MED ORDER — ASPIRIN 81 MG PO CHEW
324.0000 mg | CHEWABLE_TABLET | Freq: Once | ORAL | Status: AC
  Administered 2023-06-27: 324 mg via ORAL
  Filled 2023-06-27: qty 4

## 2023-06-27 MED ORDER — ASPIRIN 81 MG PO TBEC
81.0000 mg | DELAYED_RELEASE_TABLET | Freq: Every day | ORAL | Status: DC
  Administered 2023-06-28 – 2023-06-30 (×3): 81 mg via ORAL
  Filled 2023-06-27 (×3): qty 1

## 2023-06-27 MED ORDER — HEPARIN (PORCINE) 25000 UT/250ML-% IV SOLN
1900.0000 [IU]/h | INTRAVENOUS | Status: DC
  Administered 2023-06-27: 1600 [IU]/h via INTRAVENOUS
  Administered 2023-06-27: 1400 [IU]/h via INTRAVENOUS
  Filled 2023-06-27 (×2): qty 250

## 2023-06-27 MED ORDER — IOHEXOL 350 MG/ML SOLN
75.0000 mL | Freq: Once | INTRAVENOUS | Status: AC | PRN
  Administered 2023-06-27: 75 mL via INTRAVENOUS

## 2023-06-27 MED ORDER — PANTOPRAZOLE SODIUM 40 MG PO TBEC
40.0000 mg | DELAYED_RELEASE_TABLET | Freq: Every day | ORAL | Status: DC
  Administered 2023-06-27 – 2023-06-30 (×4): 40 mg via ORAL
  Filled 2023-06-27 (×4): qty 1

## 2023-06-27 MED ORDER — SODIUM CHLORIDE 0.9 % IV SOLN: 250.0000 mL | INTRAVENOUS | Status: DC | PRN

## 2023-06-27 MED ORDER — ONDANSETRON HCL 4 MG/2ML IJ SOLN: 4.0000 mg | Freq: Four times a day (QID) | INTRAMUSCULAR | Status: DC | PRN

## 2023-06-27 MED ORDER — SODIUM CHLORIDE 0.9% FLUSH
3.0000 mL | Freq: Two times a day (BID) | INTRAVENOUS | Status: DC
  Administered 2023-06-27 – 2023-06-30 (×5): 3 mL via INTRAVENOUS

## 2023-06-27 NOTE — ED Notes (Signed)
Sbar report posted with contact number posted. Nurse name in Purple man not included.

## 2023-06-27 NOTE — H&P (Signed)
History and Physical    Patient: Seth [name]:[id number] DOB: 1961-10-27 DOA: 06/27/2023 DOS: the patient was seen and examined on 06/27/2023 PCP: Gareth Morgan, MD  Patient coming from: Home  Chief Complaint:  Chief Complaint  Patient presents with   Chest Pain   HPI: Seth [name] is a 61 y.o. male with medical history significant of dyslipidemia, tobacco abuse, GERD and HTN; who presented to ED with complaints of CP. Patient symptoms started around 9:00 pm on 06/26/23 and hs been ongoing intermittently and worsening since then; patient reported some radiation to his left side, but mainly mid chest. Patien is blunt/pressure in quality and was 8-9/10 in intensity a its worse. Patient reported mild association with SOB and then good relieved with the use of Nitroglycerin provided in the ED. Patient denies coughing spells, fever, sick contacts, hematuria, dysuria, melena, hematochezia, focal weakness or any other complaints.  In the ED EKG failed to demonstrated acute ischemic changes, but his troponin came back elevated in the 200 and then 1112 range.  Case discussed with cardiology service who recommended admission to [facility] and initiation of heparin drip/NTG drip. TRH consulted to assist with further care and transfer.  Review of Systems: As mentioned in the history of present illness. All other systems reviewed and are negative. Past Medical History:  Diagnosis Date   PONV (postoperative nausea and vomiting)    Past Surgical History:  Procedure Laterality Date   HERNIA REPAIR Right    INGUINAL HERNIA REPAIR Left 02/16/2015   Procedure: LEFT INGUINAL HERNIORRHAPHY;  Surgeon: Dalia Heading, MD;  Location: [facility];  Service: General;  Laterality: Left;   INSERTION OF MESH Left 02/16/2015   Procedure: INSERTION OF MESH;  Surgeon: Dalia Heading, MD;  Location: [facility];  Service: General;  Laterality: Left;   TONSILLECTOMY     Social History:  reports that he has been smoking  cigarettes. He has a 25.00 pack-year smoking history. He has quit using smokeless tobacco. He reports that he does not currently use drugs after having used the following drugs: Marijuana. Frequency: 0.50 times per week. He reports that he does not drink alcohol.  Allergies  Allergen Reactions   Penicillins Other (See Comments)    Childhood allergy    Family History  Problem Relation Age of Onset   Cancer Mother        Alzheimers?   Cancer Father    Anuerysm Sister    Anuerysm Brother     Prior to Admission medications   Medication Sig Start Date End Date Taking? Authorizing Provider  aspirin 325 MG tablet Take 325 mg by mouth every 6 (six) hours as needed. For chest pain     [provider]  fish oil-omega-3 fatty acids 1000 MG capsule Take 1 g by mouth daily.      [provider]  Multiple Vitamin (MULTIVITAMIN WITH MINERALS) TABS tablet Take 1 tablet by mouth daily.    [provider]  naproxen (NAPROSYN) 500 MG tablet Take 500 mg by mouth 2 (two) times daily as needed for mild pain.    [provider]  oxyCODONE-acetaminophen (PERCOCET) 7.5-325 MG per tablet Take 1-2 tablets by mouth every 4 (four) hours as needed. Patient not taking: Reported on 03/16/2015 02/16/15   Franky Macho, MD  tamsulosin (FLOMAX) 0.4 MG CAPS capsule Take 1 capsule (0.4 mg total) by mouth daily. 03/16/15   Ivery Quale, PA-C    Physical Exam: Vitals:   06/27/23 1100 06/27/23  1115 06/27/23 1130 06/27/23 1238  BP: 113/87 120/82 123/86 (!) 120/90  Pulse: 67 70 63 71  Resp: (!) 28 (!) 22 19 15   Temp:    98.1 F (36.7 C)  TempSrc:    Oral  SpO2: 94% 94% 95% 96%  Weight:      Height:       General exam: Alert, awake, oriented x 3; reporting just pressure and 1-2 pain intensity in his chest at time of my evaluation. Respiratory system: Clear to auscultation. Respiratory effort normal. Good saturation on RA. Cardiovascular system:RRR. No murmurs, rubs, gallops or  JVD. Gastrointestinal system: Abdomen is nondistended, soft and nontender. No organomegaly or masses felt. Normal bowel sounds heard. Central nervous system: Alert and oriented. No focal neurological deficits. Extremities: No Cyanosis or Clubbing.  Skin: No petechiae. Psychiatry: Judgement and insight appear normal. Mood & affect appropriate.   Data Reviewed: Troponin: 200 >> 1112 BMET: sodium 136, potassium 3.6, chloride 105, CO2 22, glucose 181, BUN 10, Cr 0.69 and GFR > 60 CBC: WBX's 11.7, Hgb 14.8 and platelets 313K CT angiogram of chest: no PE, no aortic dissection changes.   Assessment and Plan: 1-NSTEMI -heparin drip and NTG drip started -[facility] -coreg, statin, aspirin and plavix started  -checking TSH, lipid panel and A1C -PRN morphine to be provided -follow cardiology service recommendations and clinical response.  2-BPH -continue flomax  3-GERD -continue PPI  4-tobacco abuse -cessation counseling provided  5-HTN -continue coreg and follow BP -heart healthy diet discussed with patient  6-hyperglycemia -A1C ordered -no prior hx of diabetes. -Follow CBG fluctuation.    Advance Care Planning:   Code Status: Full Code   Consults: cardiology service   Family Communication: no family at bedside.  Severity of Illness: The appropriate patient status for this patient is INPATIENT. Inpatient status is judged to be reasonable and necessary in order to provide the required intensity of service to ensure the patient's safety. The patient's presenting symptoms, physical exam findings, and initial radiographic and laboratory data in the context of their chronic comorbidities is felt to place them at high risk for further clinical deterioration. Furthermore, it is not anticipated that the patient will be medically stable for discharge from the hospital within 2 midnights of admission.   * I certify that at the point of  admission it is my clinical judgment that the patient will require inpatient hospital care spanning beyond 2 midnights from the point of admission due to high intensity of service, high risk for further deterioration and high frequency of surveillance required.*  Author: Vassie Loll, MD 06/27/2023 1:29 PM  For on call review [email or web address].

## 2023-06-27 NOTE — ED Notes (Signed)
ED TO INPATIENT HANDOFF REPORT  ED Nurse Name and Phone #: [phone]  S Name/Age/Gender Seth [name] 62 y.o. male Room/Bed: APA11/APA11  Code Status   Code Status: Full Code  Home/SNF/Other Home Patient oriented to: self, place, time, and situation Is this baseline? Yes   Triage Complete: Triage complete  Chief Complaint NSTEMI (non-ST elevated myocardial infarction) [facility]) [I21.4]  Triage Note Pt had chest pain since 9pm. Pt is a smoker and had allergies, so was sneezing and coughing and took 800mg  ibuprofen for the pain. Pt was sweating this morning, concerned about the pain, so came in to the ER.   Allergies Allergies  Allergen Reactions   Penicillins Other (See Comments)    Childhood allergy    Level of Care/Admitting Diagnosis ED Disposition     ED Disposition  Admit   Condition  --   Comment  Hospital Area: [facility] [[id number]]  Level of Care: Progressive [102]  Admit to Progressive based on following criteria: CARDIOVASCULAR & THORACIC of moderate stability with acute coronary syndrome symptoms/low risk myocardial infarction/hypertensive urgency/arrhythmias/heart failure potentially compromising stability and stable post cardiovascular intervention patients.  May admit patient to [facility] or [facility] if equivalent level of care is available:: No  Covid Evaluation: Asymptomatic - no recent exposure (last 10 days) testing not required  Diagnosis: NSTEMI (non-ST elevated myocardial infarction) [facility]) [[id number]]  Admitting Physician: Vassie Loll [[id number]]  Attending Physician: Vassie Loll [[id number]]  Certification:: I certify this patient will need inpatient services for at least 2 midnights  Estimated Length of Stay: 2          B Medical/Surgery History Past Medical History:  Diagnosis Date   PONV (postoperative nausea and vomiting)    Past Surgical History:  Procedure Laterality Date   HERNIA REPAIR Right    INGUINAL HERNIA  REPAIR Left 02/16/2015   Procedure: LEFT INGUINAL HERNIORRHAPHY;  Surgeon: Dalia Heading, MD;  Location: [facility];  Service: General;  Laterality: Left;   INSERTION OF MESH Left 02/16/2015   Procedure: INSERTION OF MESH;  Surgeon: Dalia Heading, MD;  Location: [facility];  Service: General;  Laterality: Left;   TONSILLECTOMY       A IV Location/Drains/Wounds Patient Lines/Drains/Airways Status     Active Line/Drains/Airways     Name Placement date Placement time Site Days   Peripheral IV 06/27/23 20 G 1" Right Antecubital 06/27/23  0746  Antecubital  less than 1   Peripheral IV 06/27/23 20 G 1" Left Antecubital 06/27/23  0940  Antecubital  less than 1            Intake/Output Last 24 hours No intake or output data in the 24 hours ending 06/27/23 1414  Labs/Imaging Results for orders placed or performed during the hospital encounter of 06/27/23 (from the past 48 hour(s))  Troponin I (High Sensitivity)     Status: Abnormal   Collection Time: 06/27/23  7:57 AM  Result Value Ref Range   Troponin I (High Sensitivity) 242 (HH) <18 ng/L    Comment: CRITICAL RESULT CALLED TO, READ BACK BY AND VERIFIED WITH [place] M @ 0836 ON [id number] BY HENDERSON L (NOTE) Elevated high sensitivity troponin I (hsTnI) values and significant  changes across serial measurements may suggest ACS but many other  chronic and acute conditions are known to elevate hsTnI results.  Refer to the "Links" section for chest pain algorithms and additional  guidance. Performed at [facility], [address], [place], [place]  [address]   CBC     Status: Abnormal   Collection Time: 06/27/23  7:57 AM  Result Value Ref Range   WBC 11.7 (H) 4.0 - 10.5 K/uL   RBC 5.08 4.22 - 5.81 MIL/uL   Hemoglobin 14.8 13.0 - 17.0 g/dL   HCT 60.4 54.0 - 98.1 %   MCV 86.8 80.0 - 100.0 fL   MCH 29.1 26.0 - 34.0 pg   MCHC 33.6 30.0 - 36.0 g/dL   RDW 19.1 47.8 - 29.5 %   Platelets 313 150 - 400 K/uL   nRBC 0.0 0.0 - 0.2 %    Comment:  Performed at [facility], [address], [place], [place] [address]  Basic metabolic panel     Status: Abnormal   Collection Time: 06/27/23  7:57 AM  Result Value Ref Range   Sodium 136 135 - 145 mmol/L   Potassium 3.6 3.5 - 5.1 mmol/L   Chloride 105 98 - 111 mmol/L   CO2 22 22 - 32 mmol/L   Glucose, Bld 181 (H) 70 - 99 mg/dL    Comment: Glucose reference range applies only to samples taken after fasting for at least 8 hours.   BUN 10 8 - 23 mg/dL   Creatinine, Ser 8.65 0.61 - 1.24 mg/dL   Calcium 8.8 (L) 8.9 - 10.3 mg/dL   GFR, Estimated >78 >46 mL/min    Comment: (NOTE) Calculated using the CKD-EPI Creatinine Equation (2021)    Anion gap 9 5 - 15    Comment: Performed at [facility], [address], [place], [place] [address]  Troponin I (High Sensitivity)     Status: Abnormal   Collection Time: 06/27/23  9:40 AM  Result Value Ref Range   Troponin I (High Sensitivity) 1,112 (HH) <18 ng/L    Comment: DELTA CHECK NOTED CRITICAL RESULT CALLED TO, READ BACK BY AND VERIFIED WITH GANT E @ 1044 ON [id number] BY HENDERSON L (NOTE) Elevated high sensitivity troponin I (hsTnI) values and significant  changes across serial measurements may suggest ACS but many other  chronic and acute conditions are known to elevate hsTnI results.  Refer to the "Links" section for chest pain algorithms and additional  guidance. Performed at [facility], [address] [place], [place] [address]   Magnesium     Status: None   Collection Time: 06/27/23  9:40 AM  Result Value Ref Range   Magnesium 2.2 1.7 - 2.4 mg/dL    Comment: Performed at [facility], [address], [place], [place] [address]  TSH     Status: None   Collection Time: 06/27/23  9:40 AM  Result Value Ref Range   TSH 0.512 0.350 - 4.500 uIU/mL    Comment: Performed by a 3rd Generation assay with a functional sensitivity of <=0.01 uIU/mL. Performed at [facility], [address], [place], [place] [address]    CT Angio Chest PE W and/or  Wo Contrast  Result Date: 06/27/2023 CLINICAL DATA:  Chest pain EXAM: CT ANGIOGRAPHY CHEST WITH CONTRAST TECHNIQUE: Multidetector CT imaging of the chest was performed using the standard protocol during bolus administration of intravenous contrast. Multiplanar CT image reconstructions and MIPs were obtained to evaluate the vascular anatomy. RADIATION DOSE REDUCTION: This exam was performed according to the departmental dose-optimization program which includes automated exposure control, adjustment of the mA and/or kV according to patient size and/or use of iterative reconstruction technique. CONTRAST:  75mL OMNIPAQUE IOHEXOL 350 MG/ML SOLN COMPARISON:  No comparison studies available. FINDINGS:  Cardiovascular: The heart size is normal. No substantial pericardial effusion. No thoracic aortic aneurysm. No substantial atherosclerosis of the thoracic aorta. There is no filling defect within the opacified pulmonary arteries to suggest the presence of an acute pulmonary embolus. Mediastinum/Nodes: No mediastinal lymphadenopathy. There is no hilar lymphadenopathy. The esophagus has normal imaging features. There is no axillary lymphadenopathy. Lungs/Pleura: Centrilobular emphsyema noted. Dependent ground-glass attenuation in both lower lobes is likely atelectatic. No dense focal consolidative airspace disease. No suspicious pulmonary nodule or mass. No pleural effusion. Upper Abdomen: The liver shows diffusely decreased attenuation suggesting fat deposition. Layering tiny calcified gallstones evident. Musculoskeletal: No worrisome lytic or sclerotic osseous abnormality. Review of the MIP images confirms the above findings. IMPRESSION: 1. No CT evidence for acute pulmonary embolus. 2. Hepatic steatosis. 3. Cholelithiasis. 4.  Emphysema. ([id number]) Electronically Signed   By: Kennith Center M.D.   On: 06/27/2023 11:02   DG Chest 2 View  Result Date: 06/27/2023 CLINICAL DATA:  Chest pain EXAM: CHEST - 2 VIEW COMPARISON:   07/29/2011 FINDINGS: Heart size and mediastinal contours are normal. No pleural fluid or interstitial edema. No airspace opacities identified. Coarsened interstitial markings in central airway thickening identified bilaterally. IMPRESSION: Central airway thickening and coarsened interstitial markings may reflect bronchitis or reactive airways disease. No focal pneumonia. Electronically Signed   By: Signa Kell M.D.   On: 06/27/2023 08:17    Pending Labs Unresulted Labs (From admission, onward)     Start     Ordered   06/28/23 0500  Heparin level (unfractionated)  Daily,   R      06/27/23 0912   06/28/23 0500  CBC  Daily,   R      06/27/23 0912   06/28/23 0500  Lipoprotein A (LPA)  Tomorrow morning,   R        06/27/23 1149   06/28/23 0500  Lipid panel  Tomorrow morning,   R        06/27/23 1149   06/28/23 0500  Basic metabolic panel  Tomorrow morning,   R        06/27/23 1149   06/28/23 0500  HIV Antibody (routine testing w rflx)  Once,   R        06/28/23 0500   06/27/23 1700  Heparin level (unfractionated)  Once-Timed,   URGENT        06/27/23 0912   06/27/23 1145  Hemoglobin A1c  Once,   R        06/27/23 1149            Vitals/Pain Today's Vitals   06/27/23 1230 06/27/23 1238 06/27/23 1245 06/27/23 1355  BP: (!) 120/90 (!) 120/90 116/79   Pulse: 68 71 65   Resp: (!) 26 15 (!) 23   Temp:  98.1 F (36.7 C)    TempSrc:  Oral    SpO2: 94% 96% 92%   Weight:      Height:      PainSc:  2   2     Isolation Precautions No active isolations  Medications Medications  nitroGLYCERIN 50 mg in dextrose 5 % 250 mL (0.2 mg/mL) infusion (25 mcg/min Intravenous Rate/Dose Change 06/27/23 0923)  heparin ADULT infusion 100 units/mL (25000 units/248mL) (1,400 Units/hr Intravenous New Bag/Given 06/27/23 0952)  aspirin EC tablet 81 mg (has no administration in time range)  nitroGLYCERIN (NITROSTAT) SL tablet 0.4 mg (has no administration in time range)  acetaminophen (TYLENOL) tablet  650 mg (650 mg Oral Given  06/27/23 1244)  ondansetron (ZOFRAN) injection 4 mg (has no administration in time range)  sodium chloride flush (NS) 0.9 % injection 3 mL (3 mLs Intravenous Not Given 06/27/23 1355)  sodium chloride flush (NS) 0.9 % injection 3 mL (has no administration in time range)  0.9 %  sodium chloride infusion (has no administration in time range)  clopidogrel (PLAVIX) tablet 75 mg (has no administration in time range)  carvedilol (COREG) tablet 3.125 mg (has no administration in time range)  atorvastatin (LIPITOR) tablet 40 mg (40 mg Oral Given 06/27/23 1221)  nicotine (NICODERM CQ - dosed in mg/24 hours) patch 14 mg (14 mg Transdermal Patch Applied 06/27/23 1221)  tamsulosin (FLOMAX) capsule 0.4 mg (0.4 mg Oral Given 06/27/23 1244)  morphine (PF) 2 MG/ML injection 1-2 mg (has no administration in time range)  pantoprazole (PROTONIX) EC tablet 40 mg (has no administration in time range)  aspirin chewable tablet 324 mg (324 mg Oral Given 06/27/23 0856)  heparin bolus via infusion 4,000 Units (4,000 Units Intravenous Bolus from Bag 06/27/23 0955)  iohexol (OMNIPAQUE) 350 MG/ML injection 75 mL (75 mLs Intravenous Contrast Given 06/27/23 1029)    Mobility walks     Focused Assessments    R Recommendations: See Admitting Provider Note  Report given to:   Additional Notes:

## 2023-06-27 NOTE — ED Provider Notes (Signed)
[place] EMERGENCY DEPARTMENT AT [facility] Provider Note   CSN: [id number] Arrival date & time: 06/27/23  4098     History  Chief Complaint  Patient presents with   Chest Pain    Seth [name] is a 62 y.o. male.   Chest Pain Patient presents with chest pain.  Anterior chest.  Began 9:00 last night when he coughed.  Has had anterior pain since then.  Took some ibuprofen this morning.  Reportedly had some sweating.  States he does not have air conditioning in his house since it broke 3 weeks ago and is waiting to get it fixed.  Patient states he walks for his job and has been able to walk the last few days without any difficulty.  States pain is worse with certain movements.  Not worse with a deep breath.  Does not feel short of breath.  Patient is a smoker.    Past Medical History:  Diagnosis Date   PONV (postoperative nausea and vomiting)     Home Medications Prior to Admission medications   Medication Sig Start Date End Date Taking? Authorizing Provider  aspirin 325 MG tablet Take 325 mg by mouth every 6 (six) hours as needed. For chest pain     [provider]  azithromycin (ZITHROMAX Z-PAK) 250 MG tablet Take 2 tablets (500 mg total) by mouth daily. 03/13/18   Burgess Amor, PA-C  ciprofloxacin (CIPRO) 500 MG tablet Take 1 tablet (500 mg total) by mouth 2 (two) times daily. 03/16/15   Ivery Quale, PA-C  fish oil-omega-3 fatty acids 1000 MG capsule Take 1 g by mouth daily.      [provider]  ibuprofen (ADVIL,MOTRIN) 200 MG tablet Take 800 mg by mouth every 6 (six) hours as needed. For pain     [provider]  Multiple Vitamin (MULTIVITAMIN WITH MINERALS) TABS tablet Take 1 tablet by mouth daily.    [provider]  naproxen (NAPROSYN) 500 MG tablet Take 500 mg by mouth 2 (two) times daily as needed for mild pain.    [provider]  oxyCODONE-acetaminophen (PERCOCET) 7.5-325 MG per tablet Take 1-2 tablets by mouth  every 4 (four) hours as needed. Patient not taking: Reported on 03/16/2015 02/16/15   Franky Macho, MD  tamsulosin (FLOMAX) 0.4 MG CAPS capsule Take 1 capsule (0.4 mg total) by mouth daily. 03/16/15   Ivery Quale, PA-C      Allergies    Penicillins    Review of Systems   Review of Systems  Cardiovascular:  Positive for chest pain.    Physical Exam Updated Vital Signs BP 123/86   Pulse 63   Temp 97.8 F (36.6 C)   Resp 19   Ht 6\' 3"  (1.905 m)   Wt 99.8 kg   SpO2 95%   BMI 27.50 kg/m  Physical Exam Vitals and nursing note reviewed.  Cardiovascular:     Rate and Rhythm: Regular rhythm.  Pulmonary:     Breath sounds: No decreased breath sounds, wheezing or rhonchi.  Chest:     Chest wall: No tenderness.  Abdominal:     Tenderness: There is no abdominal tenderness.  Musculoskeletal:     Right lower leg: No edema.     Left lower leg: No edema.  Skin:    General: Skin is warm.     Capillary Refill: Capillary refill takes less than 2 seconds.  Neurological:     Mental Status: He is alert.  ED Results / Procedures / Treatments   Labs (all labs ordered are listed, but only abnormal results are displayed) Labs Reviewed  CBC - Abnormal; Notable for the following components:      Result Value   WBC 11.7 (*)    All other components within normal limits  BASIC METABOLIC PANEL - Abnormal; Notable for the following components:   Glucose, Bld 181 (*)    Calcium 8.8 (*)    All other components within normal limits  TROPONIN I (HIGH SENSITIVITY) - Abnormal; Notable for the following components:   Troponin I (High Sensitivity) 242 (*)    All other components within normal limits  TROPONIN I (HIGH SENSITIVITY) - Abnormal; Notable for the following components:   Troponin I (High Sensitivity) 1,112 (*)    All other components within normal limits  HEPARIN LEVEL (UNFRACTIONATED)    EKG EKG Interpretation Date/Time:  Saturday June 27 2023 09:06:38 EDT Ventricular Rate:   64 PR Interval:  157 QRS Duration:  110 QT Interval:  409 QTC Calculation: 422 R Axis:   56  Text Interpretation: Sinus rhythm Probable anteroseptal infarct, old Confirmed by Benjiman Core [id number]) on 06/27/2023 10:41:21 AM  Radiology CT Angio Chest PE W and/or Wo Contrast  Result Date: 06/27/2023 CLINICAL DATA:  Chest pain EXAM: CT ANGIOGRAPHY CHEST WITH CONTRAST TECHNIQUE: Multidetector CT imaging of the chest was performed using the standard protocol during bolus administration of intravenous contrast. Multiplanar CT image reconstructions and MIPs were obtained to evaluate the vascular anatomy. RADIATION DOSE REDUCTION: This exam was performed according to the departmental dose-optimization program which includes automated exposure control, adjustment of the mA and/or kV according to patient size and/or use of iterative reconstruction technique. CONTRAST:  75mL OMNIPAQUE IOHEXOL 350 MG/ML SOLN COMPARISON:  No comparison studies available. FINDINGS: Cardiovascular: The heart size is normal. No substantial pericardial effusion. No thoracic aortic aneurysm. No substantial atherosclerosis of the thoracic aorta. There is no filling defect within the opacified pulmonary arteries to suggest the presence of an acute pulmonary embolus. Mediastinum/Nodes: No mediastinal lymphadenopathy. There is no hilar lymphadenopathy. The esophagus has normal imaging features. There is no axillary lymphadenopathy. Lungs/Pleura: Centrilobular emphsyema noted. Dependent ground-glass attenuation in both lower lobes is likely atelectatic. No dense focal consolidative airspace disease. No suspicious pulmonary nodule or mass. No pleural effusion. Upper Abdomen: The liver shows diffusely decreased attenuation suggesting fat deposition. Layering tiny calcified gallstones evident. Musculoskeletal: No worrisome lytic or sclerotic osseous abnormality. Review of the MIP images confirms the above findings. IMPRESSION: 1. No CT evidence  for acute pulmonary embolus. 2. Hepatic steatosis. 3. Cholelithiasis. 4.  Emphysema. ([id number]) Electronically Signed   By: Kennith Center M.D.   On: 06/27/2023 11:02   DG Chest 2 View  Result Date: 06/27/2023 CLINICAL DATA:  Chest pain EXAM: CHEST - 2 VIEW COMPARISON:  07/29/2011 FINDINGS: Heart size and mediastinal contours are normal. No pleural fluid or interstitial edema. No airspace opacities identified. Coarsened interstitial markings in central airway thickening identified bilaterally. IMPRESSION: Central airway thickening and coarsened interstitial markings may reflect bronchitis or reactive airways disease. No focal pneumonia. Electronically Signed   By: Signa Kell M.D.   On: 06/27/2023 08:17    Procedures Procedures    Medications Ordered in ED Medications  nitroGLYCERIN 50 mg in dextrose 5 % 250 mL (0.2 mg/mL) infusion (25 mcg/min Intravenous Rate/Dose Change 06/27/23 0923)  heparin ADULT infusion 100 units/mL (25000 units/269mL) (1,400 Units/hr Intravenous New Bag/Given 06/27/23 0952)  aspirin chewable tablet 324 mg (  324 mg Oral Given 06/27/23 0856)  heparin bolus via infusion 4,000 Units (4,000 Units Intravenous Bolus from Bag 06/27/23 0955)  iohexol (OMNIPAQUE) 350 MG/ML injection 75 mL (75 mLs Intravenous Contrast Given 06/27/23 1029)    ED Course/ Medical Decision Making/ A&P                             Medical Decision Making Amount and/or Complexity of Data Reviewed Labs: ordered. Radiology: ordered.  Risk OTC drugs. Prescription drug management.   Patient chest pain.  Anterior chest.  Began acutely after coughing.  Is a smoker.  EKG reassuring.  Differential diagnose includes pneumonia, pneumothorax, coronary disease.  Will get x-ray basic blood work.  Also has hypertension.  Initial troponin elevated at 200.  Then repeat at over thousand.  Pain improved with heparin and decreasing blood pressure.  CT scan done and does not show pulmonary embolism.  Aortic dissection  felt less likely.  Discussed with Dr. Antoine Poche from cardiology.  Requests medicine admission with transfer down to [facility].  CRITICAL CARE Performed by: Benjiman Core Total critical care time: 45 minutes Critical care time was exclusive of separately billable procedures and treating other patients. Critical care was necessary to treat or prevent imminent or life-threatening deterioration. Critical care was time spent personally by me on the following activities: development of treatment plan with patient and/or surrogate as well as nursing, discussions with consultants, evaluation of patient's response to treatment, examination of patient, obtaining history from patient or surrogate, ordering and performing treatments and interventions, ordering and review of laboratory studies, ordering and review of radiographic studies, pulse oximetry and re-evaluation of patient's condition.         Final Clinical Impression(s) / ED Diagnoses Final diagnoses:  NSTEMI (non-ST elevated myocardial infarction) [facility])    Rx / DC Orders ED Discharge Orders     None         Benjiman Core, MD 06/27/23 1135

## 2023-06-27 NOTE — Progress Notes (Signed)
ANTICOAGULATION CONSULT NOTE - Follow Up Consult  Pharmacy Consult for heparin Indication: chest pain/ACS  Allergies  Allergen Reactions   Penicillins Other (See Comments)    Childhood allergy    Patient Measurements: Height: 6\' 3"  (190.5 cm) Weight: 102.5 kg (225 lb 15.5 oz) IBW/kg (Calculated) : 84.5 Heparin Dosing Weight: ~100 kg  Vital Signs: Temp: 98.8 F (37.1 C) (07/06 1913) Temp Source: Oral (07/06 1913) BP: 104/87 (07/06 1913) Pulse Rate: 79 (07/06 1913)  Labs: Recent Labs    06/27/23 0757 06/27/23 0940 06/27/23 1819  HGB 14.8  --   --   HCT 44.1  --   --   PLT 313  --   --   HEPARINUNFRC  --   --  0.14*  CREATININE 0.69  --   --   TROPONINIHS 242* 1,112*  --     Estimated Creatinine Clearance: 125.8 mL/min (by C-G formula based on SCr of 0.69 mg/dL).   Medications:  Medications Prior to Admission  Medication Sig Dispense Refill Last Dose   ibuprofen (ADVIL) 200 MG tablet Take 200 mg by mouth every 6 (six) hours as needed for headache or mild pain.   unk   Multiple Vitamin (MULTIVITAMIN WITH MINERALS) TABS tablet Take 1 tablet by mouth daily.   06/27/2023   Nutritional Supplements (PROSTATE PO) Take 1 tablet by mouth daily.   06/27/2023    Assessment: 62 year old male admitted to [facility] with chest pain. Patient does not appear to be on anticoagulants prior to admit. New orders to start IV heparin.  Patient was transferred to [facility] on heparin. RN has been unable to verify if infusion was interrupted or stopped. Heparin level at 1819 was subtherapeutic at 0.14.    Goal of Therapy:  Heparin level 0.3-0.7 units/ml Monitor platelets by anticoagulation protocol: Yes   Plan:  Increase heparin infusion to 1600 units/hr Check anti-Xa level in 6 hours and daily while on heparin Continue to monitor H&H and platelets   Thank you for allowing pharmacy to be a part of this patient's care.   Signe Colt, PharmD 06/27/2023 8:09 PM  **Pharmacist phone  directory can be found on [email or web address] listed under [facility]**

## 2023-06-27 NOTE — ED Triage Notes (Signed)
Pt had chest pain since 9pm. Pt is a smoker and had allergies, so was sneezing and coughing and took 800mg  ibuprofen for the pain. Pt was sweating this morning, concerned about the pain, so came in to the ER.

## 2023-06-27 NOTE — Plan of Care (Signed)

## 2023-06-27 NOTE — Progress Notes (Signed)
ANTICOAGULATION CONSULT NOTE - Initial Consult  Pharmacy Consult for heparin Indication: chest pain/ACS  Allergies  Allergen Reactions   Penicillins Other (See Comments)    Childhood allergy    Patient Measurements: Height: 6\' 3"  (190.5 cm) Weight: 99.8 kg (220 lb) IBW/kg (Calculated) : 84.5 Heparin Dosing Weight: 100kg  Vital Signs: Temp: 97.8 F (36.6 C) (07/06 0718) Temp Source: Oral (07/06 0705) BP: 159/102 (07/06 0718) Pulse Rate: 65 (07/06 0718)  Labs: Recent Labs    06/27/23 0757  HGB 14.8  HCT 44.1  PLT 313  CREATININE 0.69  TROPONINIHS 242*    Estimated Creatinine Clearance: 115.9 mL/min (by C-G formula based on SCr of 0.69 mg/dL).   Medical History: Past Medical History:  Diagnosis Date   PONV (postoperative nausea and vomiting)     Assessment: 62 year old male admitted to [facility] with chest pain. Patient does not appear to be on anticoagulants prior to admit. New orders to start IV heparin.   Goal of Therapy:  Heparin level 0.3-0.7 units/ml Monitor platelets by anticoagulation protocol: Yes   Plan:  Give 4000 units bolus x 1 Start heparin infusion at 1400 units/hr Check anti-Xa level in 6 hours and daily while on heparin Continue to monitor H&H and platelets  Sheppard Coil PharmD., BCPS Clinical Pharmacist 06/27/2023 9:11 AM

## 2023-06-27 NOTE — Progress Notes (Signed)
Received patient per [facility] from [facility] on heparin infusion @ 14 ml/hr and NTG drip at 25 mcg/min. Alert and oriented x 4 and ambulatory. Denies chest pain or SOB. In no acute distress. Call bell placed within reach, Meals ordered.

## 2023-06-28 ENCOUNTER — Inpatient Hospital Stay (HOSPITAL_COMMUNITY)
Admission: EM | Disposition: A | Discharge status: Home / Self Care | Payer: Self-pay | Source: Home / Self Care | Attending: Cardiovascular Disease

## 2023-06-28 ENCOUNTER — Inpatient Hospital Stay (HOSPITAL_COMMUNITY): Payer: Medicaid Other

## 2023-06-28 DIAGNOSIS — Z720 Tobacco use: Secondary | ICD-10-CM

## 2023-06-28 DIAGNOSIS — I10 Essential (primary) hypertension: Secondary | ICD-10-CM

## 2023-06-28 DIAGNOSIS — I2510 Atherosclerotic heart disease of native coronary artery without angina pectoris: Secondary | ICD-10-CM

## 2023-06-28 DIAGNOSIS — I2109 ST elevation (STEMI) myocardial infarction involving other coronary artery of anterior wall: Secondary | ICD-10-CM | POA: Diagnosis present

## 2023-06-28 DIAGNOSIS — R079 Chest pain, unspecified: Secondary | ICD-10-CM

## 2023-06-28 DIAGNOSIS — I2102 ST elevation (STEMI) myocardial infarction involving left anterior descending coronary artery: Secondary | ICD-10-CM

## 2023-06-28 DIAGNOSIS — I214 Non-ST elevation (NSTEMI) myocardial infarction: Secondary | ICD-10-CM

## 2023-06-28 HISTORY — PX: CORONARY/GRAFT ACUTE MI REVASCULARIZATION: CATH118305

## 2023-06-28 LAB — ECHOCARDIOGRAM COMPLETE
Area-P 1/2: 3.7 cm2
Calc EF: 53.2 %
Height: 75 in
S' Lateral: 2.9 cm
Single Plane A2C EF: 51.4 %
Single Plane A4C EF: 53.6 %
Weight: 3545.6 oz

## 2023-06-28 LAB — BASIC METABOLIC PANEL
Anion gap: 10 (ref 5–15)
BUN: 7 mg/dL — ABNORMAL LOW (ref 8–23)
CO2: 23 mmol/L (ref 22–32)
Calcium: 8.9 mg/dL (ref 8.9–10.3)
Chloride: 105 mmol/L (ref 98–111)
Creatinine, Ser: 0.72 mg/dL (ref 0.61–1.24)
GFR, Estimated: >60 mL/min (ref >60)
Glucose, Bld: 127 mg/dL — ABNORMAL HIGH (ref 70–99)
Potassium: 3.7 mmol/L (ref 3.5–5.1)
Sodium: 138 mmol/L (ref 135–145)

## 2023-06-28 LAB — CBC
HCT: 40.7 % (ref 39.0–52.0)
Hemoglobin: 14.2 g/dL (ref 13.0–17.0)
MCH: 30.1 pg (ref 26.0–34.0)
MCHC: 34.9 g/dL (ref 30.0–36.0)
MCV: 86.2 fL (ref 80.0–100.0)
Platelets: 319 10*3/uL (ref 150–400)
RBC: 4.72 MIL/uL (ref 4.22–5.81)
RDW: 14.4 % (ref 11.5–15.5)
WBC: 15.5 10*3/uL — ABNORMAL HIGH (ref 4.0–10.5)
nRBC: 0 % (ref 0.0–0.2)

## 2023-06-28 LAB — LIPID PANEL
Cholesterol: 152 mg/dL (ref 0–200)
HDL: 33 mg/dL — ABNORMAL LOW (ref >40)
LDL Cholesterol: 72 mg/dL (ref 0–99)
Total CHOL/HDL Ratio: 4.6 RATIO
Triglycerides: 234 mg/dL — ABNORMAL HIGH (ref <150)
VLDL: 47 mg/dL — ABNORMAL HIGH (ref 0–40)

## 2023-06-28 LAB — MRSA NEXT GEN BY PCR, NASAL: MRSA by PCR Next Gen: NOT DETECTED

## 2023-06-28 LAB — POCT ACTIVATED CLOTTING TIME
Activated Clotting Time: 269 seconds
Activated Clotting Time: 360 seconds

## 2023-06-28 LAB — HEPARIN LEVEL (UNFRACTIONATED): Heparin Unfractionated: 0.21 IU/mL — ABNORMAL LOW (ref 0.30–0.70)

## 2023-06-28 SURGERY — CORONARY/GRAFT ACUTE MI REVASCULARIZATION
Anesthesia: LOCAL

## 2023-06-28 MED ORDER — MIDAZOLAM HCL 2 MG/2ML IJ SOLN
INTRAMUSCULAR | Status: DC | PRN
  Administered 2023-06-28 (×2): 1 mg via INTRAVENOUS

## 2023-06-28 MED ORDER — LABETALOL HCL 5 MG/ML IV SOLN: 10.0000 mg | INTRAVENOUS | Status: AC | PRN

## 2023-06-28 MED ORDER — HEPARIN (PORCINE) IN NACL 1000-0.9 UT/500ML-% IV SOLN
INTRAVENOUS | Status: DC | PRN
  Administered 2023-06-28 (×3): 500 mL

## 2023-06-28 MED ORDER — HEPARIN SODIUM (PORCINE) 1000 UNIT/ML IJ SOLN
INTRAMUSCULAR | Status: AC
  Filled 2023-06-28: qty 10

## 2023-06-28 MED ORDER — SODIUM CHLORIDE 0.9% FLUSH: 3.0000 mL | INTRAVENOUS | Status: DC | PRN

## 2023-06-28 MED ORDER — VERAPAMIL HCL 2.5 MG/ML IV SOLN
INTRAVENOUS | Status: AC
  Filled 2023-06-28: qty 2

## 2023-06-28 MED ORDER — ACETAMINOPHEN 325 MG PO TABS: 650.0000 mg | ORAL_TABLET | ORAL | Status: DC | PRN

## 2023-06-28 MED ORDER — TIROFIBAN (AGGRASTAT) BOLUS VIA INFUSION
INTRAVENOUS | Status: DC | PRN
  Administered 2023-06-28: 2512.5 ug via INTRAVENOUS

## 2023-06-28 MED ORDER — FENTANYL CITRATE (PF) 100 MCG/2ML IJ SOLN
INTRAMUSCULAR | Status: DC | PRN
  Administered 2023-06-28: 50 ug via INTRAVENOUS
  Administered 2023-06-28 (×2): 25 ug via INTRAVENOUS

## 2023-06-28 MED ORDER — TIROFIBAN HCL IN NACL 5-0.9 MG/100ML-% IV SOLN
INTRAVENOUS | Status: AC | PRN
  Administered 2023-06-28: .15 ug/kg/min via INTRAVENOUS

## 2023-06-28 MED ORDER — POTASSIUM CHLORIDE CRYS ER 20 MEQ PO TBCR
20.0000 meq | EXTENDED_RELEASE_TABLET | Freq: Once | ORAL | Status: AC
  Administered 2023-06-28: 20 meq via ORAL
  Filled 2023-06-28: qty 1

## 2023-06-28 MED ORDER — FUROSEMIDE 10 MG/ML IJ SOLN
INTRAMUSCULAR | Status: DC | PRN
  Administered 2023-06-28: 40 mg via INTRAVENOUS

## 2023-06-28 MED ORDER — ONDANSETRON HCL 4 MG/2ML IJ SOLN: 4.0000 mg | Freq: Four times a day (QID) | INTRAMUSCULAR | Status: DC | PRN

## 2023-06-28 MED ORDER — FUROSEMIDE 10 MG/ML IJ SOLN
40.0000 mg | Freq: Every day | INTRAMUSCULAR | Status: DC
  Administered 2023-06-28: 40 mg via INTRAVENOUS
  Filled 2023-06-28: qty 4

## 2023-06-28 MED ORDER — CHLORHEXIDINE GLUCONATE CLOTH 2 % EX PADS
6.0000 | MEDICATED_PAD | Freq: Every day | CUTANEOUS | Status: DC
  Administered 2023-06-28 – 2023-06-30 (×3): 6 via TOPICAL

## 2023-06-28 MED ORDER — TIROFIBAN HCL IN NACL 5-0.9 MG/100ML-% IV SOLN
INTRAVENOUS | Status: AC
  Filled 2023-06-28: qty 100

## 2023-06-28 MED ORDER — LIDOCAINE HCL (PF) 1 % IJ SOLN
INTRAMUSCULAR | Status: AC
  Filled 2023-06-28: qty 30

## 2023-06-28 MED ORDER — SODIUM CHLORIDE 0.9% FLUSH
3.0000 mL | Freq: Two times a day (BID) | INTRAVENOUS | Status: DC
  Administered 2023-06-28 – 2023-06-30 (×5): 3 mL via INTRAVENOUS

## 2023-06-28 MED ORDER — TICAGRELOR 90 MG PO TABS
ORAL_TABLET | ORAL | Status: DC | PRN
  Administered 2023-06-28: 180 mg via ORAL

## 2023-06-28 MED ORDER — TIROFIBAN HCL IN NACL 5-0.9 MG/100ML-% IV SOLN
0.1500 ug/kg/min | INTRAVENOUS | Status: AC
  Administered 2023-06-28: 0.15 ug/kg/min via INTRAVENOUS
  Filled 2023-06-28: qty 100

## 2023-06-28 MED ORDER — IOHEXOL 350 MG/ML SOLN
INTRAVENOUS | Status: DC | PRN
  Administered 2023-06-28: 120 mL

## 2023-06-28 MED ORDER — HYDRALAZINE HCL 20 MG/ML IJ SOLN: 10.0000 mg | INTRAMUSCULAR | Status: AC | PRN

## 2023-06-28 MED ORDER — HEPARIN BOLUS VIA INFUSION
3000.0000 [IU] | Freq: Once | INTRAVENOUS | Status: AC
  Administered 2023-06-28: 3000 [IU] via INTRAVENOUS
  Filled 2023-06-28: qty 3000

## 2023-06-28 MED ORDER — TICAGRELOR 90 MG PO TABS
90.0000 mg | ORAL_TABLET | Freq: Two times a day (BID) | ORAL | Status: DC
  Administered 2023-06-28 – 2023-06-30 (×4): 90 mg via ORAL
  Filled 2023-06-28 (×4): qty 1

## 2023-06-28 MED ORDER — FUROSEMIDE 10 MG/ML IJ SOLN
INTRAMUSCULAR | Status: AC
  Filled 2023-06-28: qty 4

## 2023-06-28 MED ORDER — FENTANYL CITRATE (PF) 100 MCG/2ML IJ SOLN
INTRAMUSCULAR | Status: AC
  Filled 2023-06-28: qty 2

## 2023-06-28 MED ORDER — MIDAZOLAM HCL 2 MG/2ML IJ SOLN
INTRAMUSCULAR | Status: AC
  Filled 2023-06-28: qty 2

## 2023-06-28 MED ORDER — LIDOCAINE HCL (PF) 1 % IJ SOLN
INTRAMUSCULAR | Status: DC | PRN
  Administered 2023-06-28: 2 mL via INTRADERMAL

## 2023-06-28 MED ORDER — SODIUM CHLORIDE 0.9 % IV SOLN
250.0000 mL | INTRAVENOUS | Status: DC | PRN
  Administered 2023-06-28: 250 mL via INTRAVENOUS

## 2023-06-28 MED ORDER — TICAGRELOR 90 MG PO TABS
ORAL_TABLET | ORAL | Status: AC
  Filled 2023-06-28: qty 2

## 2023-06-28 MED ORDER — HEPARIN SODIUM (PORCINE) 1000 UNIT/ML IJ SOLN
INTRAMUSCULAR | Status: DC | PRN
  Administered 2023-06-28: 10000 [IU] via INTRAVENOUS

## 2023-06-28 SURGICAL SUPPLY — 27 items
BALL SAPPHIRE NC24 3.0X12 (BALLOONS) ×2
BALLN EMERGE MR 2.0X12 (BALLOONS) ×1
BALLN ~~LOC~~ EMERGE MR 3.25X8 (BALLOONS) ×1
BALLN ~~LOC~~ EMERGE MR 3.5X12 (BALLOONS) ×1
BALLOON EMERGE MR 2.0X12 (BALLOONS) IMPLANT
BALLOON SAPPHIRE NC24 3.0X12 (BALLOONS) IMPLANT
BALLOON ~~LOC~~ EMERGE MR 3.25X8 (BALLOONS) IMPLANT
BALLOON ~~LOC~~ EMERGE MR 3.5X12 (BALLOONS) IMPLANT
CATH 5FR JL3.5 JR4 ANG PIG MP (CATHETERS) IMPLANT
CATH OPTICROSS HD (CATHETERS) IMPLANT
CATH VISTA GUIDE 6FR XBLAD3.5 (CATHETERS) IMPLANT
DEVICE RAD COMP TR BAND LRG (VASCULAR PRODUCTS) IMPLANT
GLIDESHEATH SLEND SS 6F .021 (SHEATH) IMPLANT
GUIDEWIRE INQWIRE 1.5J.035X260 (WIRE) IMPLANT
INQWIRE 1.5J .035X260CM (WIRE) ×1
KIT ENCORE 26 ADVANTAGE (KITS) IMPLANT
KIT HEART LEFT (KITS) ×1 IMPLANT
PACK CARDIAC CATHETERIZATION (CUSTOM PROCEDURE TRAY) ×1 IMPLANT
SHEATH PROBE COVER 6X72 (BAG) IMPLANT
SLED PULL BACK IVUS (MISCELLANEOUS) IMPLANT
STENT SYNERGY XD 3.0X12 (Permanent Stent) IMPLANT
STENT SYNERGY XD 3.0X16 (Permanent Stent) IMPLANT
SYNERGY XD 3.0X12 (Permanent Stent) ×1 IMPLANT
SYNERGY XD 3.0X16 (Permanent Stent) ×1 IMPLANT
TRANSDUCER W/STOPCOCK (MISCELLANEOUS) ×1 IMPLANT
TUBING CIL FLEX 10 FLL-RA (TUBING) ×1 IMPLANT
WIRE ASAHI PROWATER 180CM (WIRE) IMPLANT

## 2023-06-28 NOTE — Progress Notes (Signed)
Patient had a STEMI alert early morning today and taken emergently to Cath Lab and subsequently admitted to intensive care unit under cardiology care. TRH will sign off.  Please call if needed.  Patient not examined today.  No charge.

## 2023-06-28 NOTE — Consult Note (Signed)
Cardiology Consultation   Patient ID: Seth [name] MRN: [id number]; DOB: Mar 24, 1961  Admit date: 06/27/2023 Date of Consult: 06/28/2023  PCP:  Gareth Morgan, MD   [facility] Providers Cardiologist:  None        Patient Profile:   Seth [name] is a 62 y.o. male with a hx of HTN, BPH and active smoker who is being seen 06/28/2023 for the evaluation of chest pain at the request of IM.  History of Present Illness:   Seth [name] states that on Friday night he developed sudden onset of left sided chest pain after a a cough spell. He describes the pain as a dull sensation, 7/10 without significant relievers. He was able to sleep through it but woke up Saturday morning with no improvement in symptoms. Shortly after he developed nausea and decided to go to the ER.    Past Medical History:  Diagnosis Date   PONV (postoperative nausea and vomiting)     Past Surgical History:  Procedure Laterality Date   HERNIA REPAIR Right    INGUINAL HERNIA REPAIR Left 02/16/2015   Procedure: LEFT INGUINAL HERNIORRHAPHY;  Surgeon: Dalia Heading, MD;  Location: [facility];  Service: General;  Laterality: Left;   INSERTION OF MESH Left 02/16/2015   Procedure: INSERTION OF MESH;  Surgeon: Dalia Heading, MD;  Location: [facility];  Service: General;  Laterality: Left;   TONSILLECTOMY       Home Medications:  Prior to Admission medications   Medication Sig Start Date End Date Taking? Authorizing Provider  ibuprofen (ADVIL) 200 MG tablet Take 200 mg by mouth every 6 (six) hours as needed for headache or mild pain.   Yes [provider]  Multiple Vitamin (MULTIVITAMIN WITH MINERALS) TABS tablet Take 1 tablet by mouth daily.   Yes [provider]  Nutritional Supplements (PROSTATE PO) Take 1 tablet by mouth daily.   Yes [provider]    Inpatient Medications: Scheduled Meds:  aspirin EC  81 mg Oral Daily   atorvastatin  40 mg Oral Daily   carvedilol  3.125 mg  Oral BID WC   clopidogrel  75 mg Oral Q breakfast   nicotine  14 mg Transdermal Daily   pantoprazole  40 mg Oral Daily   sodium chloride flush  3 mL Intravenous Q12H   tamsulosin  0.4 mg Oral Daily   Continuous Infusions:  sodium chloride     heparin 1,900 Units/hr (06/28/23 0320)   nitroGLYCERIN 5 mcg/min (06/27/23 2351)   PRN Meds: sodium chloride, acetaminophen, morphine injection, nitroGLYCERIN, ondansetron (ZOFRAN) IV, sodium chloride flush  Allergies:    Allergies  Allergen Reactions   Penicillins Other (See Comments)    Childhood allergy    Social History:   Social History   Socioeconomic History   Marital status: Single    Spouse name: Not on file   Number of children: Not on file   Years of education: Not on file   Highest education level: Not on file  Occupational History   Not on file  Tobacco Use   Smoking status: Every Day    Packs/day: 1.00    Years: 25.00    Additional pack years: 0.00    Total pack years: 25.00    Types: Cigarettes   Smokeless tobacco: Former   Tobacco comments:    1 pk a day.  Substance and Sexual Activity   Alcohol use: No   Drug use: Not Currently  Frequency: 0.5 times per week    Types: Marijuana    Comment: gummies   Sexual activity: Never    Birth control/protection: None  Other Topics Concern   Not on file  Social History Narrative   Not on file   Social Determinants of Health   Financial Resource Strain: Not on file  Food Insecurity: No Food Insecurity (06/27/2023)   Hunger Vital Sign    Worried About Running Out of Food in the Last Year: Never true    Ran Out of Food in the Last Year: Never true  Transportation Needs: No Transportation Needs (06/27/2023)   PRAPARE - Administrator, Civil Service (Medical): No    Lack of Transportation (Non-Medical): No  Physical Activity: Not on file  Stress: Not on file  Social Connections: Not on file  Intimate Partner Violence: Not At Risk (06/27/2023)    Humiliation, Afraid, Rape, and Kick questionnaire    Fear of Current or Ex-Partner: No    Emotionally Abused: No    Physically Abused: No    Sexually Abused: No    Family History:    Family History  Problem Relation Age of Onset   Cancer Mother        Alzheimers?   Cancer Father    Anuerysm Sister    Anuerysm Brother      ROS:  Please see the history of present illness.   All other ROS reviewed and negative.     Physical Exam/Data:   Vitals:   06/28/23 0000 06/28/23 0013 06/28/23 0342 06/28/23 0400  BP:  109/83 136/83   Pulse:  78 69   Resp:  18 20   Temp:  97.8 F (36.6 C) 98.2 F (36.8 C)   TempSrc:  Oral Oral   SpO2: 96% 94% 94% 95%  Weight:   100.5 kg   Height:        Intake/Output Summary (Last 24 hours) at 06/28/2023 0500 Last data filed at 06/28/2023 0340 Gross per 24 hour  Intake 235.81 ml  Output 450 ml  Net -214.19 ml      06/28/2023    3:42 AM 06/27/2023    5:43 PM 06/27/2023    7:22 AM  Last 3 Weights  Weight (lbs) 221 lb 9.6 oz 225 lb 15.5 oz 220 lb  Weight (kg) 100.517 kg 102.5 kg 99.791 kg     Body mass index is 27.7 kg/m.  General:  Well nourished, well developed, in no acute distress HEENT: normal Neck: no JVD Vascular: No carotid bruits; Distal pulses 2+ bilaterally Cardiac:  normal S1, S2; RRR; no murmur  Lungs:  clear to auscultation bilaterally, no wheezing, rhonchi or rales  Abd: soft, nontender, no hepatomegaly  Ext: no edema Musculoskeletal:  No deformities, BUE and BLE strength normal and equal Skin: warm and dry  Neuro:  CNs 2-12 intact, no focal abnormalities noted Psych:  Normal affect   EKG:  The EKG was personally reviewed and demonstrates:  NSR  Relevant CV Studies:  Laboratory Data:  High Sensitivity Troponin:   Recent Labs  Lab 06/27/23 0757 06/27/23 0940  TROPONINIHS 242* 1,112*     Chemistry Recent Labs  Lab 06/27/23 0757 06/27/23 0940  NA 136  --   K 3.6  --   CL 105  --   CO2 22  --   GLUCOSE 181*   --   BUN 10  --   CREATININE 0.69  --   CALCIUM 8.8*  --  MG  --  2.2  GFRNONAA >60  --   ANIONGAP 9  --     No results for input(s): "PROT", "ALBUMIN", "AST", "ALT", "ALKPHOS", "BILITOT" in the last 168 hours. Lipids No results for input(s): "CHOL", "TRIG", "HDL", "LABVLDL", "LDLCALC", "CHOLHDL" in the last 168 hours.  Hematology Recent Labs  Lab 06/27/23 0757  WBC 11.7*  RBC 5.08  HGB 14.8  HCT 44.1  MCV 86.8  MCH 29.1  MCHC 33.6  RDW 14.5  PLT 313   Thyroid  Recent Labs  Lab 06/27/23 0940  TSH 0.512    BNPNo results for input(s): "BNP", "PROBNP" in the last 168 hours.  DDimer No results for input(s): "DDIMER" in the last 168 hours.   Radiology/Studies:  CT Angio Chest PE W and/or Wo Contrast  Result Date: 06/27/2023 CLINICAL DATA:  Chest pain EXAM: CT ANGIOGRAPHY CHEST WITH CONTRAST TECHNIQUE: Multidetector CT imaging of the chest was performed using the standard protocol during bolus administration of intravenous contrast. Multiplanar CT image reconstructions and MIPs were obtained to evaluate the vascular anatomy. RADIATION DOSE REDUCTION: This exam was performed according to the departmental dose-optimization program which includes automated exposure control, adjustment of the mA and/or kV according to patient size and/or use of iterative reconstruction technique. CONTRAST:  75mL OMNIPAQUE IOHEXOL 350 MG/ML SOLN COMPARISON:  No comparison studies available. FINDINGS: Cardiovascular: The heart size is normal. No substantial pericardial effusion. No thoracic aortic aneurysm. No substantial atherosclerosis of the thoracic aorta. There is no filling defect within the opacified pulmonary arteries to suggest the presence of an acute pulmonary embolus. Mediastinum/Nodes: No mediastinal lymphadenopathy. There is no hilar lymphadenopathy. The esophagus has normal imaging features. There is no axillary lymphadenopathy. Lungs/Pleura: Centrilobular emphsyema noted. Dependent  ground-glass attenuation in both lower lobes is likely atelectatic. No dense focal consolidative airspace disease. No suspicious pulmonary nodule or mass. No pleural effusion. Upper Abdomen: The liver shows diffusely decreased attenuation suggesting fat deposition. Layering tiny calcified gallstones evident. Musculoskeletal: No worrisome lytic or sclerotic osseous abnormality. Review of the MIP images confirms the above findings. IMPRESSION: 1. No CT evidence for acute pulmonary embolus. 2. Hepatic steatosis. 3. Cholelithiasis. 4.  Emphysema. ([id number]) Electronically Signed   By: Kennith Center M.D.   On: 06/27/2023 11:02   DG Chest 2 View  Result Date: 06/27/2023 CLINICAL DATA:  Chest pain EXAM: CHEST - 2 VIEW COMPARISON:  07/29/2011 FINDINGS: Heart size and mediastinal contours are normal. No pleural fluid or interstitial edema. No airspace opacities identified. Coarsened interstitial markings in central airway thickening identified bilaterally. IMPRESSION: Central airway thickening and coarsened interstitial markings may reflect bronchitis or reactive airways disease. No focal pneumonia. Electronically Signed   By: Signa Kell M.D.   On: 06/27/2023 08:17     Assessment and Plan:   62 y.o. male with a hx of HTN, BPH and active smoker who was admitted with an NSTEMI.  # NSTEMI # Hypertension # Active smoker  - Describes left sided chest pain, 7/10, radiating to the left arm. Currently chest pain free on nitroglycerin gtt - No known history of CAD - Risk factors include HTN, active smoker and DM (newly diagnosed) - Hypertensive on arrival at 170/106 mmHg - Labs relevant for trop of 242 -> 1112 - Started on heparin infusion at OSH PLAN: - Loaded with ASA 324 at OSH. Continue ASA 81 mg daily - Continue heparin gtt and IV nitroglycerin - Atorvastatin 40 mg daily - Coreg 3.125 mg BID - Hold second antiplatelet agent  until coronary anatomy is defined - Lipid panel, Lp(a) - Echo in the  morning - Left heart catheterization - Smoking cessation  # Type 2 DM - New diagnosis. HbA1c 6.8 - May be a candidate for SGLT2 inhibitors    Risk Assessment/Risk Scores:          For questions or updates, please contact [facility] Please consult [email or web address] for contact info under    Signed, Regan Rakers, MD  06/28/2023 5:00 AM

## 2023-06-28 NOTE — Progress Notes (Signed)
Echocardiogram 2D Echocardiogram has been performed.  Warren Lacy Cassanda Walmer RDCS 06/28/2023, 2:36 PM

## 2023-06-28 NOTE — Progress Notes (Signed)
   06/28/23 0806  Spiritual Encounters  Type of Visit Initial  Care provided to: Patient  Conversation partners present during encounter Nurse;Physician  Reason for visit Code  OnCall Visit Yes   Responded to a code STEMI in 6E30. Medical team assisted patient. Patient taken to Cath Lab. From room.

## 2023-06-28 NOTE — Progress Notes (Signed)
ANTICOAGULATION CONSULT NOTE - Follow Up Consult  Pharmacy Consult for heparin Indication:  NSTEMI  Labs: Recent Labs    06/27/23 0757 06/27/23 0940 06/27/23 1819 06/28/23 0201  HGB 14.8  --   --   --   HCT 44.1  --   --   --   PLT 313  --   --   --   HEPARINUNFRC  --   --  0.14* 0.21*  CREATININE 0.69  --   --   --   TROPONINIHS 242* 1,112*  --   --     Assessment: 61yo male subtherapeutic on heparin after rate change; no infusion issues or signs of bleeding per RN.  Goal of Therapy:  Heparin level 0.3-0.7 units/ml   Plan:  3000 units heparin bolus. Increase heparin infusion by 3 units/kg/hr to 1900 units/hr. Check level in 6 hours.   Vernard Gambles, PharmD, BCPS 06/28/2023 3:19 AM

## 2023-06-28 NOTE — Progress Notes (Signed)
0620 CP 10/10 oxygen placed, increased nitroglycerin drip, 2 mg morphine given, EKG completed. Pt pain 7/10. Dr Jayme Cloud called and rapid response called. 0630 Dr Jayme Cloud at bedside and Mindy, RN rapid response at bedside. Cath lab paged. Second EKG completed. Dr. Clifton James came to bedside and spoke to pt about heart cath procedure. Pt pain 1/10. Pt taken to cath lab.

## 2023-06-28 NOTE — Significant Event (Signed)
Rapid Response Event Note   Reason for Call :  CP-10/10, STEMI on EKG  Initial Focused Assessment:  Pt lying in bed with eyes open, alert and oriented, visibly uncomfortable. Pt is pale and diaphoretic. Pt c/o 10/10 mid sternal CP. Lungs CTA.   HR-78, SBP-161/103, RR-22, SpO2-96% on 4L [place]  EKG showing STE in V leads. Code STEMI initiated at 0641. CP decreased to 4/10 after morphine given and ntg gtt titrated.  EKG done 10 minutes later showing some improvement of STE.  Interventions:  EKG X 2 [place]  2mg  morphine NTG gtt increased to Code STEMI initiated: Pt placed on zoll monitor Plan of Care:  Pt to CCL at 0715 with 2 RR RNs and cards PA Dion Body.   Event Summary:   MD Notified: Dr. Jayme Cloud at bedside PTA RRT Call [phone] Arrival [id number] End [id number]  Terrilyn Saver, RN

## 2023-06-28 NOTE — Progress Notes (Signed)
  Cardiology update: Called at bedside around 6:30 due to sudden onset 10/10 left sided chest pain. First ECG showed ventricular bigemini with ST segment elevation in high lateral leads. Patient diaphoretic and nauseated. Hemodynamically stable. Increased NTG drip to 50 and administered 2 of IV morphine and STEMI paged out. Second ECG confirmed high lateral ST segment elevation with what appears to be peaked T wave on V3 and ST elevation on V4-V6.  Patient will be taken for emergent LHC/PCI

## 2023-06-29 ENCOUNTER — Encounter (HOSPITAL_COMMUNITY): Payer: Self-pay | Admitting: Cardiovascular Disease

## 2023-06-29 ENCOUNTER — Other Ambulatory Visit (HOSPITAL_COMMUNITY): Payer: Self-pay

## 2023-06-29 DIAGNOSIS — I214 Non-ST elevation (NSTEMI) myocardial infarction: Secondary | ICD-10-CM

## 2023-06-29 LAB — BASIC METABOLIC PANEL
Anion gap: 9 (ref 5–15)
Anion gap: 10 (ref 5–15)
BUN: 64 mg/dL — ABNORMAL HIGH (ref 8–23)
BUN: 12 mg/dL (ref 8–23)
CO2: 21 mmol/L — ABNORMAL LOW (ref 22–32)
CO2: 25 mmol/L (ref 22–32)
Calcium: 8.3 mg/dL — ABNORMAL LOW (ref 8.9–10.3)
Calcium: 9 mg/dL (ref 8.9–10.3)
Chloride: 113 mmol/L — ABNORMAL HIGH (ref 98–111)
Chloride: 101 mmol/L (ref 98–111)
Creatinine, Ser: 2.81 mg/dL — ABNORMAL HIGH (ref 0.61–1.24)
Creatinine, Ser: 0.87 mg/dL (ref 0.61–1.24)
GFR, Estimated: 25 mL/min — ABNORMAL LOW (ref >60)
GFR, Estimated: >60 mL/min (ref >60)
Glucose, Bld: 202 mg/dL — ABNORMAL HIGH (ref 70–99)
Glucose, Bld: 137 mg/dL — ABNORMAL HIGH (ref 70–99)
Potassium: 3.8 mmol/L (ref 3.5–5.1)
Potassium: 3.2 mmol/L — ABNORMAL LOW (ref 3.5–5.1)
Sodium: 143 mmol/L (ref 135–145)
Sodium: 136 mmol/L (ref 135–145)

## 2023-06-29 LAB — LIPID PANEL
Cholesterol: 98 mg/dL (ref 0–200)
HDL: 14 mg/dL — ABNORMAL LOW (ref >40)
LDL Cholesterol: 65 mg/dL (ref 0–99)
Total CHOL/HDL Ratio: 7 RATIO
Triglycerides: 95 mg/dL (ref <150)
VLDL: 19 mg/dL (ref 0–40)

## 2023-06-29 LAB — CBC
HCT: 43.2 % (ref 39.0–52.0)
Hemoglobin: 14.6 g/dL (ref 13.0–17.0)
MCH: 29.2 pg (ref 26.0–34.0)
MCHC: 33.8 g/dL (ref 30.0–36.0)
MCV: 86.4 fL (ref 80.0–100.0)
Platelets: 312 10*3/uL (ref 150–400)
RBC: 5 MIL/uL (ref 4.22–5.81)
RDW: 14.3 % (ref 11.5–15.5)
WBC: 14 10*3/uL — ABNORMAL HIGH (ref 4.0–10.5)
nRBC: 0 % (ref 0.0–0.2)

## 2023-06-29 LAB — LIPOPROTEIN A (LPA): Lipoprotein (a): 18.5 nmol/L (ref <75.0)

## 2023-06-29 LAB — MAGNESIUM: Magnesium: 2.3 mg/dL (ref 1.7–2.4)

## 2023-06-29 LAB — GLUCOSE, CAPILLARY
Glucose-Capillary: 140 mg/dL — ABNORMAL HIGH (ref 70–99)
Glucose-Capillary: 139 mg/dL — ABNORMAL HIGH (ref 70–99)

## 2023-06-29 MED ORDER — LIVING WELL WITH DIABETES BOOK
Freq: Once | Status: AC
  Filled 2023-06-29: qty 1

## 2023-06-29 MED ORDER — INSULIN ASPART 100 UNIT/ML IJ SOLN
0.0000 [IU] | Freq: Three times a day (TID) | INTRAMUSCULAR | Status: DC
  Administered 2023-06-29: 2 [IU] via SUBCUTANEOUS
  Administered 2023-06-30: 3 [IU] via SUBCUTANEOUS

## 2023-06-29 MED ORDER — POTASSIUM CHLORIDE CRYS ER 20 MEQ PO TBCR
60.0000 meq | EXTENDED_RELEASE_TABLET | Freq: Once | ORAL | Status: AC
  Administered 2023-06-29: 60 meq via ORAL
  Filled 2023-06-29: qty 3

## 2023-06-29 MED ORDER — CARVEDILOL 6.25 MG PO TABS
6.2500 mg | ORAL_TABLET | Freq: Two times a day (BID) | ORAL | Status: DC
  Administered 2023-06-29 – 2023-06-30 (×3): 6.25 mg via ORAL
  Filled 2023-06-29 (×3): qty 1

## 2023-06-29 MED ORDER — HEART ATTACK BOUNCING BOOK
Freq: Once | Status: AC
  Filled 2023-06-29: qty 1

## 2023-06-29 MED ORDER — EMPAGLIFLOZIN 10 MG PO TABS
10.0000 mg | ORAL_TABLET | Freq: Every day | ORAL | Status: DC
  Administered 2023-06-30: 10 mg via ORAL
  Filled 2023-06-29: qty 1

## 2023-06-29 MED FILL — Verapamil HCl IV Soln 2.5 MG/ML: INTRAVENOUS | Qty: 2 | Status: AC

## 2023-06-29 NOTE — TOC CM/SW Note (Signed)
Transition of Care [facility]) - Inpatient Brief Assessment   Patient Details  Name: Seth [name] MRN: [id number] Date of Birth: 03-02-1961  Transition of Care [facility] CM/SW Contact:    Gala Lewandowsky, RN Phone Number: 06/29/2023, 4:16 PM   Clinical Narrative: Patient presented for chest pain. Case Manager spoke with patient regarding disposition needs. Patient is without insurance and PCP. Patient is agreeable to Case Manager scheduling an appointment at the [facility]. Appointment is on the AVS. Case Manager will follow for [facility] for medication assistance as the patient progresses.   Transition of Care Asessment: Insurance and Status: Insurance coverage has been reviewed (No insurance) Patient has primary care physician: No (Scheduled an appointment at the [facility]) Home environment has been reviewed: reviewed Prior level of function:: independent Prior/Current Home Services: No current home services Social Determinants of Health Reivew: SDOH reviewed no interventions necessary Readmission risk has been reviewed: Yes Transition of care needs: transition of care needs identified, TOC will continue to follow

## 2023-06-29 NOTE — Progress Notes (Signed)
Rounding Note    Patient Name: Seth [name] Date of Encounter: 06/29/2023  [facility] Cardiologist: Dr. Earney Hamburg  Subjective   Postop day 2 anterolateral STEMI with PCI and drug-eluting stenting.  He is currently pain-free.  Inpatient Medications    Scheduled Meds:  aspirin EC  81 mg Oral Daily   atorvastatin  40 mg Oral Daily   carvedilol  6.25 mg Oral BID WC   Chlorhexidine Gluconate Cloth  6 each Topical Daily   nicotine  14 mg Transdermal Daily   pantoprazole  40 mg Oral Daily   sodium chloride flush  3 mL Intravenous Q12H   sodium chloride flush  3 mL Intravenous Q12H   tamsulosin  0.4 mg Oral Daily   ticagrelor  90 mg Oral BID   Continuous Infusions:  sodium chloride     sodium chloride Stopped (06/29/23 0155)   nitroGLYCERIN 15 mcg/min (06/29/23 0700)   PRN Meds: sodium chloride, sodium chloride, acetaminophen, morphine injection, nitroGLYCERIN, ondansetron (ZOFRAN) IV, sodium chloride flush, sodium chloride flush   Vital Signs    Vitals:   06/29/23 0400 06/29/23 0500 06/29/23 0600 06/29/23 0700  BP: 132/83 133/75 95/81 (!) 119/90  Pulse: (!) 102 62 90 84  Resp: (!) 22 19 13 16   Temp:      TempSrc:      SpO2: 97% 93% 90% 93%  Weight:  98.6 kg    Height:        Intake/Output Summary (Last 24 hours) at 06/29/2023 0842 Last data filed at 06/29/2023 0700 Gross per 24 hour  Intake 902.68 ml  Output 1500 ml  Net -597.32 ml      06/29/2023    5:00 AM 06/28/2023    3:42 AM 06/27/2023    5:43 PM  Last 3 Weights  Weight (lbs) 217 lb 4.8 oz 221 lb 9.6 oz 225 lb 15.5 oz  Weight (kg) 98.567 kg 100.517 kg 102.5 kg      Telemetry    Sinus rhythm- Personally Reviewed  ECG    Sinus rhythm at 74 with anterolateral T wave inversion.- Personally Reviewed  Physical Exam   GEN: No acute distress.   Neck: No JVD Cardiac: RRR, no murmurs, rubs, or gallops.  Respiratory: Clear to auscultation bilaterally. GI: Soft, nontender, non-distended   MS: No edema; No deformity. Neuro:  Nonfocal  Psych: Normal affect   Labs    High Sensitivity Troponin:   Recent Labs  Lab 06/27/23 0757 06/27/23 0940  TROPONINIHS 242* 1,112*     Chemistry Recent Labs  Lab 06/27/23 0757 06/27/23 0940 06/28/23 0201 06/29/23 0357  NA 136  --  138 143  K 3.6  --  3.7 3.8  CL 105  --  105 113*  CO2 22  --  23 21*  GLUCOSE 181*  --  127* 202*  BUN 10  --  7* 64*  CREATININE 0.69  --  0.72 2.81*  CALCIUM 8.8*  --  8.9 8.3*  MG  --  2.2  --   --   GFRNONAA >60  --  >60 25*  ANIONGAP 9  --  10 9    Lipids  Recent Labs  Lab 06/29/23 0357  CHOL 98  TRIG 95  HDL 14*  LDLCALC 65  CHOLHDL 7.0    Hematology Recent Labs  Lab 06/27/23 0757 06/28/23 0201  WBC 11.7* 15.5*  RBC 5.08 4.72  HGB 14.8 14.2  HCT 44.1 40.7  MCV 86.8 86.2  MCH 29.1 30.1  MCHC 33.6 34.9  RDW 14.5 14.4  PLT 313 319   Thyroid  Recent Labs  Lab 06/27/23 0940  TSH 0.512    BNPNo results for input(s): "BNP", "PROBNP" in the last 168 hours.  DDimer No results for input(s): "DDIMER" in the last 168 hours.   Radiology    ECHOCARDIOGRAM COMPLETE  Result Date: 06/28/2023    ECHOCARDIOGRAM REPORT   Patient Name:   JAKING BRASINGTON Date of Exam: 06/28/2023 Medical Rec #:  [id number]        Height:       75.0 in Accession #:    [id number]       Weight:       221.6 lb Date of Birth:  1960-12-24       BSA:          2.292 m Patient Age:    61 years         BP:           101/89 mmHg Patient Gender: M                HR:           72 bpm. Exam Location:  Inpatient Procedure: 2D Echo, Color Doppler and Cardiac Doppler Indications:    R07.9* Chest pain, unspecified  History:        Patient has no prior history of Echocardiogram examinations.                 CAD.  Sonographer:    Irving Burton Senior RDCS Referring Phys: [id number] CHRISTOPHER D MCALHANY IMPRESSIONS  1. Left ventricular ejection fraction, by estimation, is 45 to 50%. The left ventricle has mildly decreased function. The left  ventricle demonstrates regional wall motion abnormalities (see scoring diagram/findings for description). There is mild concentric left ventricular hypertrophy. Left ventricular diastolic parameters are consistent with Grade I diastolic dysfunction (impaired relaxation).  2. Right ventricular systolic function is normal. The right ventricular size is normal. Tricuspid regurgitation signal is inadequate for assessing PA pressure.  3. The mitral valve is grossly normal. Trivial mitral valve regurgitation.  4. The aortic valve is tricuspid. Aortic valve regurgitation is not visualized.  5. Unable to estimate CVP. Comparison(s): No prior Echocardiogram. FINDINGS  Left Ventricle: Left ventricular ejection fraction, by estimation, is 45 to 50%. The left ventricle has mildly decreased function. The left ventricle demonstrates regional wall motion abnormalities. The left ventricular internal cavity size was normal in size. There is mild concentric left ventricular hypertrophy. Left ventricular diastolic parameters are consistent with Grade I diastolic dysfunction (impaired relaxation).  LV Wall Scoring: The mid and distal anterior septum, mid inferoseptal segment, apical inferior segment, and apex are akinetic. The apical anterior segment is hypokinetic. The anterior wall, entire lateral wall, inferior wall, basal anteroseptal segment, and basal inferoseptal segment are normal. Right Ventricle: The right ventricular size is normal. No increase in right ventricular wall thickness. Right ventricular systolic function is normal. Tricuspid regurgitation signal is inadequate for assessing PA pressure. Left Atrium: Left atrial size was normal in size. Right Atrium: Right atrial size was normal in size. Pericardium: There is no evidence of pericardial effusion. Presence of epicardial fat layer. Mitral Valve: The mitral valve is grossly normal. Trivial mitral valve regurgitation. Tricuspid Valve: The tricuspid valve is grossly  normal. Tricuspid valve regurgitation is trivial. Aortic Valve: The aortic valve is tricuspid. There is mild to moderate aortic valve annular calcification. Aortic valve regurgitation is not visualized. Pulmonic  Valve: The pulmonic valve was not well visualized. Pulmonic valve regurgitation is trivial. Aorta: The aortic root is normal in size and structure. Venous: Unable to estimate CVP. The inferior vena cava was not well visualized. IAS/Shunts: No atrial level shunt detected by color flow Doppler.  LEFT VENTRICLE PLAX 2D LVIDd:         4.60 cm      Diastology LVIDs:         2.90 cm      LV e' medial:    5.00 cm/s LV PW:         1.20 cm      LV E/e' medial:  7.1 LV IVS:        1.20 cm      LV e' lateral:   5.55 cm/s LVOT diam:     2.20 cm      LV E/e' lateral: 6.4 LV SV:         75 LV SV Index:   33 LVOT Area:     3.80 cm  LV Volumes (MOD) LV vol d, MOD A2C: 122.0 ml LV vol d, MOD A4C: 169.0 ml LV vol s, MOD A2C: 59.3 ml LV vol s, MOD A4C: 78.4 ml LV SV MOD A2C:     62.7 ml LV SV MOD A4C:     169.0 ml LV SV MOD BP:      78.0 ml RIGHT VENTRICLE RV S prime:     16.90 cm/s TAPSE (M-mode): 2.6 cm LEFT ATRIUM             Index        RIGHT ATRIUM           Index LA diam:        4.00 cm 1.75 cm/m   RA Area:     15.30 cm LA Vol (A2C):   54.1 ml 23.60 ml/m  RA Volume:   37.60 ml  16.40 ml/m LA Vol (A4C):   59.0 ml 25.74 ml/m LA Biplane Vol: 60.4 ml 26.35 ml/m  AORTIC VALVE LVOT Vmax:   105.00 cm/s LVOT Vmean:  82.700 cm/s LVOT VTI:    0.197 m  AORTA Ao Root diam: 3.30 cm Ao Asc diam:  3.70 cm MITRAL VALVE MV Area (PHT): 3.70 cm    SHUNTS MV Decel Time: 205 msec    Systemic VTI:  0.20 m MV E velocity: 35.60 cm/s  Systemic Diam: 2.20 cm MV A velocity: 65.00 cm/s MV E/A ratio:  0.55 Nona Dell MD Electronically signed by Nona Dell MD Signature Date/Time: 06/28/2023/2:42:04 PM    Final    CARDIAC CATHETERIZATION  Result Date: 06/28/2023   Prox RCA to Mid RCA lesion is 20% stenosed.   Prox Cx lesion is 20%  stenosed.   Prox LAD to Mid LAD lesion is 99% stenosed.   1st Diag lesion is 99% stenosed.   Mid LAD lesion is 50% stenosed.   A drug-eluting stent was successfully placed using a SYNERGY XD 3.0X12.   A drug-eluting stent was successfully placed using a SYNERGY XD 3.0X16.   Post intervention, there is a 0% residual stenosis.   Post intervention, there is a 0% residual stenosis. Acute anterolateral STEMI secondary to thrombotic sub-total occlusion of the mid LAD and large Diagonal branch. Successful PTCA/DES x 1 mid LAD Successful PTCA/DES x 1 Diagonal Moderate non-obstructive residual disease in the mid LAD beyond the stented segment Mild non-obstructive disease in the moderate caliber Circumflex and in the large dominant RCA  Elevated LV filling pressure-40 mg IV Lasix given in the cath lab (LV 148/19/32) LV gram with hypokinesis of the anterior and apical walls. Recommendations: Will transfer to the ICU. Echo today. Continue Aggrastat for 2 hours. DAPT with ASA and Brilinta for one year. High intensity statin. I anticipate that he will need to be on a beta blocker but given soft BP post cath will not start today. He was given IV Lasix in the cath lab. Will write for an additional 40 mg IV lasix tonight.   CT Angio Chest PE W and/or Wo Contrast  Result Date: 06/27/2023 CLINICAL DATA:  Chest pain EXAM: CT ANGIOGRAPHY CHEST WITH CONTRAST TECHNIQUE: Multidetector CT imaging of the chest was performed using the standard protocol during bolus administration of intravenous contrast. Multiplanar CT image reconstructions and MIPs were obtained to evaluate the vascular anatomy. RADIATION DOSE REDUCTION: This exam was performed according to the departmental dose-optimization program which includes automated exposure control, adjustment of the mA and/or kV according to patient size and/or use of iterative reconstruction technique. CONTRAST:  75mL OMNIPAQUE IOHEXOL 350 MG/ML SOLN COMPARISON:  No comparison studies available.  FINDINGS: Cardiovascular: The heart size is normal. No substantial pericardial effusion. No thoracic aortic aneurysm. No substantial atherosclerosis of the thoracic aorta. There is no filling defect within the opacified pulmonary arteries to suggest the presence of an acute pulmonary embolus. Mediastinum/Nodes: No mediastinal lymphadenopathy. There is no hilar lymphadenopathy. The esophagus has normal imaging features. There is no axillary lymphadenopathy. Lungs/Pleura: Centrilobular emphsyema noted. Dependent ground-glass attenuation in both lower lobes is likely atelectatic. No dense focal consolidative airspace disease. No suspicious pulmonary nodule or mass. No pleural effusion. Upper Abdomen: The liver shows diffusely decreased attenuation suggesting fat deposition. Layering tiny calcified gallstones evident. Musculoskeletal: No worrisome lytic or sclerotic osseous abnormality. Review of the MIP images confirms the above findings. IMPRESSION: 1. No CT evidence for acute pulmonary embolus. 2. Hepatic steatosis. 3. Cholelithiasis. 4.  Emphysema. ([id number]) Electronically Signed   By: Kennith Center M.D.   On: 06/27/2023 11:02    Cardiac Studies   Cardiac catheterization/PCI and stent (06/28/2023)  Conclusion      Prox RCA to Mid RCA lesion is 20% stenosed.   Prox Cx lesion is 20% stenosed.   Prox LAD to Mid LAD lesion is 99% stenosed.   1st Diag lesion is 99% stenosed.   Mid LAD lesion is 50% stenosed.   A drug-eluting stent was successfully placed using a SYNERGY XD 3.0X12.   A drug-eluting stent was successfully placed using a SYNERGY XD 3.0X16.   Post intervention, there is a 0% residual stenosis.   Post intervention, there is a 0% residual stenosis.   Acute anterolateral STEMI secondary to thrombotic sub-total occlusion of the mid LAD and large Diagonal branch.  Successful PTCA/DES x 1 mid LAD Successful PTCA/DES x 1 Diagonal Moderate non-obstructive residual disease in the mid LAD  beyond the stented segment Mild non-obstructive disease in the moderate caliber Circumflex and in the large dominant RCA Elevated LV filling pressure-40 mg IV Lasix given in the cath lab (LV 148/19/32) LV gram with hypokinesis of the anterior and apical walls.    Recommendations: Will transfer to the ICU. Echo today. Continue Aggrastat for 2 hours. DAPT with ASA and Brilinta for one year. High intensity statin. I anticipate that he will need to be on a beta blocker but given soft BP post cath will not start today. He was given IV Lasix in the cath lab. Will write  for an additional 40 mg IV lasix tonight.    ry Diagrams  Diagnostic Dominance: Right  Intervention   2D echocardiogram (06/28/2023)  IMPRESSIONS     1. Left ventricular ejection fraction, by estimation, is 45 to 50%. The  left ventricle has mildly decreased function. The left ventricle  demonstrates regional wall motion abnormalities (see scoring  diagram/findings for description). There is mild  concentric left ventricular hypertrophy. Left ventricular diastolic  parameters are consistent with Grade I diastolic dysfunction (impaired  relaxation).   2. Right ventricular systolic function is normal. The right ventricular  size is normal. Tricuspid regurgitation signal is inadequate for assessing  PA pressure.   3. The mitral valve is grossly normal. Trivial mitral valve  regurgitation.   4. The aortic valve is tricuspid. Aortic valve regurgitation is not  visualized.   5. Unable to estimate CVP.   Patient Profile     Seth [name] is a 62 y.o. male with a hx of HTN, BPH and active smoker who is being seen 06/28/2023 for the evaluation of chest pain at the request of IM.   Assessment & Plan    1: Non-STEMI-postop day 1 non-STEMI treated with PCI and drug-eluting stenting of the LAD and diagonal branch bifurcation by Dr. Clifton James with "T stenting" and drug-eluting stents.  His troponin level was low at 1100.  He is on  aspirin and Brilinta although cost will be an issue and will need to review with case manager and social work.  He will need to be on DAPT uninterrupted for at least 12 months.  2: Ischemic cardiomyopathy-2D echo shows an EF of 45 to 50% with wall motion normality in the LAD territory.  He is on low-dose beta-blocker which we will titrate.  Given his renal insufficiency we will hold off on beginning an ACE/ARB and we will recheck.  We will start an SGLT2 inhibitor.  3: Tobacco abuse-Long history tobacco use 1 pack/day for last 40 years committed to stop  4: Hyperlipidemia-LDL actually was 65 and low in 2 consecutive blood readings.  He will need to be on a statin drug.  5: Renal insufficiency-serum creatinine went from 0.72-2.8.  This may be related to his diuresis.  His LVEDP was elevated to 30 and 120 cc of contrast was used during the case.  Will hold his IV diuretics and recheck a basic metabolic panel.  Postop day 1 anterolateral non-STEMI treated with "T stenting of the LAD and diagonal branch.  He has been ambulating in the hallways.  Okay to transfer to telemetry, ambulate with cardiac rehab.  Follow his lab work.  Anticipate discharge in the next 24 to 48 hours depending on his lab work.  For questions or updates, please contact [facility] Please consult [email or web address] for contact info under        Signed, Nanetta Batty, MD  06/29/2023, 8:42 AM

## 2023-06-29 NOTE — Progress Notes (Signed)
CARDIAC REHAB PHASE I   PRE:  Rate/Rhythm: 79 SR    BP: sitting 120/82    SpO2: 91 RA  MODE:  Ambulation: 370 ft   POST:  Rate/Rhythm: 88 SR    BP: sitting 122/72     SpO2: 94 RA  Pt eager to ambulate again. Denied c/o walking but has been having intermittent chest discomfort at rest, 1-2 severity, 5-10 min. Noted pt SpO2 low sitting and in bed 87-91 RA. SpO2 increased with ambulation.   Discussed with pt MI, stents, Brilinta importance, smoking cessation, diet including decreasing Coke due to elevated A1C, exercise, NTG, and CRPII. Pt very receptive and motivated. Plans to quit smoking and coke (currently 6 pack a day). Put in DM educator c/s. Will refer to AP CRPII.  [id number]   Ethelda Chick BS, ACSM-CEP 06/29/2023 11:38 AM

## 2023-06-29 NOTE — Plan of Care (Signed)
  Problem: Education: Goal: Understanding of cardiac disease, CV risk reduction, and recovery process will improve Outcome: Progressing Goal: Individualized Educational Video(s) Outcome: Progressing   Problem: Activity: Goal: Ability to tolerate increased activity will improve Outcome: Progressing   Problem: Cardiac: Goal: Ability to achieve and maintain adequate cardiovascular perfusion will improve Outcome: Progressing   Problem: Health Behavior/Discharge Planning: Goal: Ability to safely manage health-related needs after discharge will improve Outcome: Progressing   Problem: Education: Goal: Knowledge of General Education information will improve Description: Including pain rating scale, medication(s)/side effects and non-pharmacologic comfort measures Outcome: Progressing   Problem: Health Behavior/Discharge Planning: Goal: Ability to manage health-related needs will improve Outcome: Progressing   Problem: Clinical Measurements: Goal: Ability to maintain clinical measurements within normal limits will improve Outcome: Progressing Goal: Will remain free from infection Outcome: Progressing Goal: Diagnostic test results will improve Outcome: Progressing Goal: Respiratory complications will improve Outcome: Progressing Goal: Cardiovascular complication will be avoided Outcome: Progressing   Problem: Activity: Goal: Risk for activity intolerance will decrease Outcome: Progressing   Problem: Nutrition: Goal: Adequate nutrition will be maintained Outcome: Progressing   Problem: Coping: Goal: Level of anxiety will decrease Outcome: Progressing   Problem: Elimination: Goal: Will not experience complications related to bowel motility Outcome: Progressing Goal: Will not experience complications related to urinary retention Outcome: Progressing   Problem: Pain Managment: Goal: General experience of comfort will improve Outcome: Progressing   Problem:  Safety: Goal: Ability to remain free from injury will improve Outcome: Progressing   Problem: Skin Integrity: Goal: Risk for impaired skin integrity will decrease Outcome: Progressing   Problem: Education: Goal: Understanding of CV disease, CV risk reduction, and recovery process will improve Outcome: Progressing Goal: Individualized Educational Video(s) Outcome: Progressing   Problem: Activity: Goal: Ability to return to baseline activity level will improve Outcome: Progressing   Problem: Cardiovascular: Goal: Ability to achieve and maintain adequate cardiovascular perfusion will improve Outcome: Progressing Goal: Vascular access site(s) Level 0-1 will be maintained Outcome: Progressing   Problem: Health Behavior/Discharge Planning: Goal: Ability to safely manage health-related needs after discharge will improve Outcome: Progressing   Problem: Education: Goal: Ability to describe self-care measures that may prevent or decrease complications (Diabetes Survival Skills Education) will improve Outcome: Progressing Goal: Individualized Educational Video(s) Outcome: Progressing   Problem: Coping: Goal: Ability to adjust to condition or change in health will improve Outcome: Progressing   Problem: Fluid Volume: Goal: Ability to maintain a balanced intake and output will improve Outcome: Progressing   Problem: Health Behavior/Discharge Planning: Goal: Ability to identify and utilize available resources and services will improve Outcome: Progressing Goal: Ability to manage health-related needs will improve Outcome: Progressing   Problem: Metabolic: Goal: Ability to maintain appropriate glucose levels will improve Outcome: Progressing   Problem: Nutritional: Goal: Maintenance of adequate nutrition will improve Outcome: Progressing Goal: Progress toward achieving an optimal weight will improve Outcome: Progressing   Problem: Skin Integrity: Goal: Risk for impaired  skin integrity will decrease Outcome: Progressing   Problem: Tissue Perfusion: Goal: Adequacy of tissue perfusion will improve Outcome: Progressing   

## 2023-06-30 ENCOUNTER — Telehealth: Payer: Self-pay | Admitting: Cardiology

## 2023-06-30 ENCOUNTER — Other Ambulatory Visit (HOSPITAL_COMMUNITY): Payer: Self-pay

## 2023-06-30 DIAGNOSIS — E118 Type 2 diabetes mellitus with unspecified complications: Secondary | ICD-10-CM | POA: Insufficient documentation

## 2023-06-30 LAB — CBC
HCT: 42.9 % (ref 39.0–52.0)
Hemoglobin: 14.6 g/dL (ref 13.0–17.0)
MCH: 29.7 pg (ref 26.0–34.0)
MCHC: 34 g/dL (ref 30.0–36.0)
MCV: 87.2 fL (ref 80.0–100.0)
Platelets: 311 10*3/uL (ref 150–400)
RBC: 4.92 MIL/uL (ref 4.22–5.81)
RDW: 14.2 % (ref 11.5–15.5)
WBC: 13.2 10*3/uL — ABNORMAL HIGH (ref 4.0–10.5)
nRBC: 0 % (ref 0.0–0.2)

## 2023-06-30 LAB — GLUCOSE, CAPILLARY
Glucose-Capillary: 105 mg/dL — ABNORMAL HIGH (ref 70–99)
Glucose-Capillary: 165 mg/dL — ABNORMAL HIGH (ref 70–99)

## 2023-06-30 LAB — BASIC METABOLIC PANEL
Anion gap: 9 (ref 5–15)
BUN: 15 mg/dL (ref 8–23)
CO2: 23 mmol/L (ref 22–32)
Calcium: 8.6 mg/dL — ABNORMAL LOW (ref 8.9–10.3)
Chloride: 101 mmol/L (ref 98–111)
Creatinine, Ser: 0.89 mg/dL (ref 0.61–1.24)
GFR, Estimated: >60 mL/min (ref >60)
Glucose, Bld: 112 mg/dL — ABNORMAL HIGH (ref 70–99)
Potassium: 3.6 mmol/L (ref 3.5–5.1)
Sodium: 133 mmol/L — ABNORMAL LOW (ref 135–145)

## 2023-06-30 MED ORDER — ASPIRIN 81 MG PO TBEC
81.0000 mg | DELAYED_RELEASE_TABLET | Freq: Every day | ORAL | 2 refills | Status: AC
  Filled 2023-06-30: qty 30, 30d supply, fill #0

## 2023-06-30 MED ORDER — LOSARTAN POTASSIUM 25 MG PO TABS
25.0000 mg | ORAL_TABLET | Freq: Every day | ORAL | 1 refills | Status: DC
  Filled 2023-06-30: qty 30, 30d supply, fill #0

## 2023-06-30 MED ORDER — NITROGLYCERIN 0.4 MG SL SUBL
0.4000 mg | SUBLINGUAL_TABLET | SUBLINGUAL | 2 refills | Status: AC | PRN
  Filled 2023-06-30: qty 25, 7d supply, fill #0

## 2023-06-30 MED ORDER — ATORVASTATIN CALCIUM 80 MG PO TABS
80.0000 mg | ORAL_TABLET | Freq: Every day | ORAL | 1 refills | Status: DC
  Filled 2023-06-30: qty 30, 30d supply, fill #0

## 2023-06-30 MED ORDER — CARVEDILOL 6.25 MG PO TABS
6.2500 mg | ORAL_TABLET | Freq: Two times a day (BID) | ORAL | 1 refills | Status: AC
  Filled 2023-06-30: qty 60, 30d supply, fill #0

## 2023-06-30 MED ORDER — TICAGRELOR 90 MG PO TABS
90.0000 mg | ORAL_TABLET | Freq: Two times a day (BID) | ORAL | 2 refills | Status: DC
  Filled 2023-06-30: qty 60, 30d supply, fill #0

## 2023-06-30 MED ORDER — LOSARTAN POTASSIUM 25 MG PO TABS: 25.0000 mg | ORAL_TABLET | Freq: Every day | ORAL | Status: DC

## 2023-06-30 MED ORDER — EMPAGLIFLOZIN 10 MG PO TABS
10.0000 mg | ORAL_TABLET | Freq: Every day | ORAL | 3 refills | Status: AC
  Filled 2023-06-30: qty 30, 30d supply, fill #0

## 2023-06-30 NOTE — Progress Notes (Signed)
CARDIAC REHAB PHASE I   PRE:  Rate/Rhythm: 68 SR    BP: sitting 120/84    SpO2:   MODE:  Ambulation: 430 ft   POST:  Rate/Rhythm: 80 SR    BP: sitting 143/95     SpO2:   Pt still having brief 1/10 chest sensation. Resolved after 5 min. None while ambulating. Discussed notifying MD if it becomes worse and gave reminders of NTG. Tolerated ambulation well. Reviewed ed, pt with good questions.  [id number]   Ethelda Chick BS, ACSM-CEP 06/30/2023 10:04 AM

## 2023-06-30 NOTE — Progress Notes (Signed)
Rounding Note    Patient Name: Seth [name] Date of Encounter: 06/30/2023  [facility] Cardiologist: Dr. Earney Hamburg  Subjective   Postop day 3 anterolateral STEMI with PCI and drug-eluting stenting.  He is currently pain-free.  Inpatient Medications    Scheduled Meds:  aspirin EC  81 mg Oral Daily   atorvastatin  40 mg Oral Daily   carvedilol  6.25 mg Oral BID WC   Chlorhexidine Gluconate Cloth  6 each Topical Daily   empagliflozin  10 mg Oral Daily   insulin aspart  0-15 Units Subcutaneous TID WC   losartan  25 mg Oral Daily   nicotine  14 mg Transdermal Daily   pantoprazole  40 mg Oral Daily   sodium chloride flush  3 mL Intravenous Q12H   sodium chloride flush  3 mL Intravenous Q12H   tamsulosin  0.4 mg Oral Daily   ticagrelor  90 mg Oral BID   Continuous Infusions:  sodium chloride     sodium chloride Stopped (06/29/23 0155)   nitroGLYCERIN 15 mcg/min (06/29/23 1647)   PRN Meds: sodium chloride, sodium chloride, acetaminophen, morphine injection, nitroGLYCERIN, ondansetron (ZOFRAN) IV, sodium chloride flush, sodium chloride flush   Vital Signs    Vitals:   06/30/23 0355 06/30/23 0400 06/30/23 0500 06/30/23 0751  BP: (!) 106/59   110/65  Pulse: 80   66  Resp: 18   20  Temp: 98.1 F (36.7 C)   98.5 F (36.9 C)  TempSrc: Oral   Oral  SpO2: 99% 96%  93%  Weight:   98.2 kg   Height:        Intake/Output Summary (Last 24 hours) at 06/30/2023 0954 Last data filed at 06/30/2023 0817 Gross per 24 hour  Intake 287.02 ml  Output --  Net 287.02 ml      06/30/2023    5:00 AM 06/29/2023    5:00 AM 06/28/2023    3:42 AM  Last 3 Weights  Weight (lbs) 216 lb 6.4 oz 217 lb 4.8 oz 221 lb 9.6 oz  Weight (kg) 98.158 kg 98.567 kg 100.517 kg      Telemetry    Sinus rhythm- Personally Reviewed  ECG    Not performed today.- Personally Reviewed  Physical Exam   GEN: No acute distress.   Neck: No JVD Cardiac: RRR, no murmurs, rubs, or gallops.   Respiratory: Clear to auscultation bilaterally. GI: Soft, nontender, non-distended  MS: No edema; No deformity. Neuro:  Nonfocal  Psych: Normal affect   Labs    High Sensitivity Troponin:   Recent Labs  Lab 06/27/23 0757 06/27/23 0940  TROPONINIHS 242* 1,112*     Chemistry Recent Labs  Lab 06/27/23 0940 06/28/23 0201 06/29/23 0357 06/29/23 0937 06/30/23 0151  NA  --    < > 143 136 133*  K  --    < > 3.8 3.2* 3.6  CL  --    < > 113* 101 101  CO2  --    < > 21* 25 23  GLUCOSE  --    < > 202* 137* 112*  BUN  --    < > 64* 12 15  CREATININE  --    < > 2.81* 0.87 0.89  CALCIUM  --    < > 8.3* 9.0 8.6*  MG 2.2  --   --  2.3  --   GFRNONAA  --    < > 25* >60 >60  ANIONGAP  --    < >  9 10 9    < > = values in this interval not displayed.    Lipids  Recent Labs  Lab 06/29/23 0357  CHOL 98  TRIG 95  HDL 14*  LDLCALC 65  CHOLHDL 7.0    Hematology Recent Labs  Lab 06/28/23 0201 06/29/23 0818 06/30/23 0151  WBC 15.5* 14.0* 13.2*  RBC 4.72 5.00 4.92  HGB 14.2 14.6 14.6  HCT 40.7 43.2 42.9  MCV 86.2 86.4 87.2  MCH 30.1 29.2 29.7  MCHC 34.9 33.8 34.0  RDW 14.4 14.3 14.2  PLT 319 312 311   Thyroid  Recent Labs  Lab 06/27/23 0940  TSH 0.512    BNPNo results for input(s): "BNP", "PROBNP" in the last 168 hours.  DDimer No results for input(s): "DDIMER" in the last 168 hours.   Radiology    ECHOCARDIOGRAM COMPLETE  Result Date: 06/28/2023    ECHOCARDIOGRAM REPORT   Patient Name:   Seth [name] Date of Exam: 06/28/2023 Medical Rec #:  [id number]        Height:       75.0 in Accession #:    [id number]       Weight:       221.6 lb Date of Birth:  01-29-1961       BSA:          2.292 m Patient Age:    61 years         BP:           101/89 mmHg Patient Gender: M                HR:           72 bpm. Exam Location:  Inpatient Procedure: 2D Echo, Color Doppler and Cardiac Doppler Indications:    R07.9* Chest pain, unspecified  History:        Patient has no prior  history of Echocardiogram examinations.                 CAD.  Sonographer:    Irving Burton Senior RDCS Referring Phys: [id number] CHRISTOPHER D MCALHANY IMPRESSIONS  1. Left ventricular ejection fraction, by estimation, is 45 to 50%. The left ventricle has mildly decreased function. The left ventricle demonstrates regional wall motion abnormalities (see scoring diagram/findings for description). There is mild concentric left ventricular hypertrophy. Left ventricular diastolic parameters are consistent with Grade I diastolic dysfunction (impaired relaxation).  2. Right ventricular systolic function is normal. The right ventricular size is normal. Tricuspid regurgitation signal is inadequate for assessing PA pressure.  3. The mitral valve is grossly normal. Trivial mitral valve regurgitation.  4. The aortic valve is tricuspid. Aortic valve regurgitation is not visualized.  5. Unable to estimate CVP. Comparison(s): No prior Echocardiogram. FINDINGS  Left Ventricle: Left ventricular ejection fraction, by estimation, is 45 to 50%. The left ventricle has mildly decreased function. The left ventricle demonstrates regional wall motion abnormalities. The left ventricular internal cavity size was normal in size. There is mild concentric left ventricular hypertrophy. Left ventricular diastolic parameters are consistent with Grade I diastolic dysfunction (impaired relaxation).  LV Wall Scoring: The mid and distal anterior septum, mid inferoseptal segment, apical inferior segment, and apex are akinetic. The apical anterior segment is hypokinetic. The anterior wall, entire lateral wall, inferior wall, basal anteroseptal segment, and basal inferoseptal segment are normal. Right Ventricle: The right ventricular size is normal. No increase in right ventricular wall thickness. Right ventricular systolic function is normal. Tricuspid regurgitation signal is inadequate  for assessing PA pressure. Left Atrium: Left atrial size was normal in size.  Right Atrium: Right atrial size was normal in size. Pericardium: There is no evidence of pericardial effusion. Presence of epicardial fat layer. Mitral Valve: The mitral valve is grossly normal. Trivial mitral valve regurgitation. Tricuspid Valve: The tricuspid valve is grossly normal. Tricuspid valve regurgitation is trivial. Aortic Valve: The aortic valve is tricuspid. There is mild to moderate aortic valve annular calcification. Aortic valve regurgitation is not visualized. Pulmonic Valve: The pulmonic valve was not well visualized. Pulmonic valve regurgitation is trivial. Aorta: The aortic root is normal in size and structure. Venous: Unable to estimate CVP. The inferior vena cava was not well visualized. IAS/Shunts: No atrial level shunt detected by color flow Doppler.  LEFT VENTRICLE PLAX 2D LVIDd:         4.60 cm      Diastology LVIDs:         2.90 cm      LV e' medial:    5.00 cm/s LV PW:         1.20 cm      LV E/e' medial:  7.1 LV IVS:        1.20 cm      LV e' lateral:   5.55 cm/s LVOT diam:     2.20 cm      LV E/e' lateral: 6.4 LV SV:         75 LV SV Index:   33 LVOT Area:     3.80 cm  LV Volumes (MOD) LV vol d, MOD A2C: 122.0 ml LV vol d, MOD A4C: 169.0 ml LV vol s, MOD A2C: 59.3 ml LV vol s, MOD A4C: 78.4 ml LV SV MOD A2C:     62.7 ml LV SV MOD A4C:     169.0 ml LV SV MOD BP:      78.0 ml RIGHT VENTRICLE RV S prime:     16.90 cm/s TAPSE (M-mode): 2.6 cm LEFT ATRIUM             Index        RIGHT ATRIUM           Index LA diam:        4.00 cm 1.75 cm/m   RA Area:     15.30 cm LA Vol (A2C):   54.1 ml 23.60 ml/m  RA Volume:   37.60 ml  16.40 ml/m LA Vol (A4C):   59.0 ml 25.74 ml/m LA Biplane Vol: 60.4 ml 26.35 ml/m  AORTIC VALVE LVOT Vmax:   105.00 cm/s LVOT Vmean:  82.700 cm/s LVOT VTI:    0.197 m  AORTA Ao Root diam: 3.30 cm Ao Asc diam:  3.70 cm MITRAL VALVE MV Area (PHT): 3.70 cm    SHUNTS MV Decel Time: 205 msec    Systemic VTI:  0.20 m MV E velocity: 35.60 cm/s  Systemic Diam: 2.20 cm MV A  velocity: 65.00 cm/s MV E/A ratio:  0.55 Nona Dell MD Electronically signed by Nona Dell MD Signature Date/Time: 06/28/2023/2:42:04 PM    Final     Cardiac Studies   Cardiac catheterization/PCI and stent (06/28/2023)  Conclusion      Prox RCA to Mid RCA lesion is 20% stenosed.   Prox Cx lesion is 20% stenosed.   Prox LAD to Mid LAD lesion is 99% stenosed.   1st Diag lesion is 99% stenosed.   Mid LAD lesion is 50% stenosed.   A drug-eluting stent was  successfully placed using a SYNERGY XD 3.0X12.   A drug-eluting stent was successfully placed using a SYNERGY XD 3.0X16.   Post intervention, there is a 0% residual stenosis.   Post intervention, there is a 0% residual stenosis.   Acute anterolateral STEMI secondary to thrombotic sub-total occlusion of the mid LAD and large Diagonal branch.  Successful PTCA/DES x 1 mid LAD Successful PTCA/DES x 1 Diagonal Moderate non-obstructive residual disease in the mid LAD beyond the stented segment Mild non-obstructive disease in the moderate caliber Circumflex and in the large dominant RCA Elevated LV filling pressure-40 mg IV Lasix given in the cath lab (LV 148/19/32) LV gram with hypokinesis of the anterior and apical walls.    Recommendations: Will transfer to the ICU. Echo today. Continue Aggrastat for 2 hours. DAPT with ASA and Brilinta for one year. High intensity statin. I anticipate that he will need to be on a beta blocker but given soft BP post cath will not start today. He was given IV Lasix in the cath lab. Will write for an additional 40 mg IV lasix tonight.    ry Diagrams  Diagnostic Dominance: Right  Intervention   2D echocardiogram (06/28/2023)  IMPRESSIONS     1. Left ventricular ejection fraction, by estimation, is 45 to 50%. The  left ventricle has mildly decreased function. The left ventricle  demonstrates regional wall motion abnormalities (see scoring  diagram/findings for description). There is mild   concentric left ventricular hypertrophy. Left ventricular diastolic  parameters are consistent with Grade I diastolic dysfunction (impaired  relaxation).   2. Right ventricular systolic function is normal. The right ventricular  size is normal. Tricuspid regurgitation signal is inadequate for assessing  PA pressure.   3. The mitral valve is grossly normal. Trivial mitral valve  regurgitation.   4. The aortic valve is tricuspid. Aortic valve regurgitation is not  visualized.   5. Unable to estimate CVP.   Patient Profile     Seth [name] is a 62 y.o. male with a hx of HTN, BPH and active smoker who is being seen 06/28/2023 for the evaluation of chest pain at the request of IM.   Assessment & Plan    1: Non-STEMI-postop day 3 non-STEMI treated with PCI and drug-eluting stenting of the LAD and diagonal branch bifurcation by Dr. Clifton James with "T stenting" and drug-eluting stents.  His troponin level was low at 1100.  He is on aspirin and Brilinta although cost will be an issue and will need to review with case manager and social work.  He will need to be on DAPT uninterrupted for at least 12 months.  2: Ischemic cardiomyopathy-2D echo shows an EF of 45 to 50% with wall motion normality in the LAD territory.  He is on low-dose beta-blocker which we will titrate.  His renal function improved.  Will start losartan 25 mg a day.  He is on Jardiance as well.  3: Tobacco abuse-Long history tobacco use 1 pack/day for last 40 years committed to stop  4: Hyperlipidemia-LDL actually was 65 and low in 2 consecutive blood readings.  He will need to be on a statin drug.  5: Renal insufficiency-serum creatinine went from 0.72-2.8.  Repeat blood work showed that his renal function had returned to normal.  6: Type 2 diabetes-hemoglobin A1c 6.8.  Currently not on meds.  Have talked about dietary modification.  This can be addressed as an outpatient by his PCP.  Postop day 3 anterolateral non-STEMI  treated with "  T stenting of the LAD and diagonal branch.  He has been ambulating in the hallways.  Okay for discharge today.  He prefers to have follow-up done in [place] where he lives.  Will arrange TOC 7 followed by an office visit with a [place] cardiologist in our group.  For questions or updates, please contact [facility] Please consult [email or web address] for contact info under        Signed, Nanetta Batty, MD  06/30/2023, 9:54 AM

## 2023-06-30 NOTE — Plan of Care (Signed)

## 2023-06-30 NOTE — Discharge Summary (Addendum)
Discharge Summary    Patient ID: Seth [name] MRN: [id number]; DOB: March 20, 1961  Admit date: 06/27/2023 Discharge date: 06/30/2023  PCP:  Gareth Morgan, MD    HeartCare Providers Cardiologist:  Seen by Dr. Allyson Sabal (wants to follow up in [place])  Discharge Diagnoses    Principal Problem:   NSTEMI (non-ST elevated myocardial infarction) [facility]) Active Problems:   Tobacco abuse   BPH (benign prostatic hyperplasia)   Acute ST elevation myocardial infarction (STEMI) of anterolateral wall (HCC)   Type 2 diabetes mellitus with complication, without long-term current use of insulin [facility])  Diagnostic Studies/Procedures    Cath: 06/28/2023    Prox RCA to Mid RCA lesion is 20% stenosed.   Prox Cx lesion is 20% stenosed.   Prox LAD to Mid LAD lesion is 99% stenosed.   1st Diag lesion is 99% stenosed.   Mid LAD lesion is 50% stenosed.   A drug-eluting stent was successfully placed using a SYNERGY XD 3.0X12.   A drug-eluting stent was successfully placed using a SYNERGY XD 3.0X16.   Post intervention, there is a 0% residual stenosis.   Post intervention, there is a 0% residual stenosis.   Acute anterolateral STEMI secondary to thrombotic sub-total occlusion of the mid LAD and large Diagonal branch.  Successful PTCA/DES x 1 mid LAD Successful PTCA/DES x 1 Diagonal Moderate non-obstructive residual disease in the mid LAD beyond the stented segment Mild non-obstructive disease in the moderate caliber Circumflex and in the large dominant RCA Elevated LV filling pressure-40 mg IV Lasix given in the cath lab (LV 148/19/32) LV gram with hypokinesis of the anterior and apical walls.    Recommendations: Will transfer to the ICU. Echo today. Continue Aggrastat for 2 hours. DAPT with ASA and Brilinta for one year. High intensity statin. I anticipate that he will need to be on a beta blocker but given soft BP post cath will not start today. He was given IV Lasix in the cath lab. Will  write for an additional 40 mg IV lasix tonight.   Diagnostic Dominance: Right  Intervention    Echo: 06/28/2023  IMPRESSIONS     1. Left ventricular ejection fraction, by estimation, is 45 to 50%. The  left ventricle has mildly decreased function. The left ventricle  demonstrates regional wall motion abnormalities (see scoring  diagram/findings for description). There is mild  concentric left ventricular hypertrophy. Left ventricular diastolic  parameters are consistent with Grade I diastolic dysfunction (impaired  relaxation).   2. Right ventricular systolic function is normal. The right ventricular  size is normal. Tricuspid regurgitation signal is inadequate for assessing  PA pressure.   3. The mitral valve is grossly normal. Trivial mitral valve  regurgitation.   4. The aortic valve is tricuspid. Aortic valve regurgitation is not  visualized.   5. Unable to estimate CVP.   Comparison(s): No prior Echocardiogram.   FINDINGS   Left Ventricle: Left ventricular ejection fraction, by estimation, is 45  to 50%. The left ventricle has mildly decreased function. The left  ventricle demonstrates regional wall motion abnormalities. The left  ventricular internal cavity size was normal  in size. There is mild concentric left ventricular hypertrophy. Left  ventricular diastolic parameters are consistent with Grade I diastolic  dysfunction (impaired relaxation).     LV Wall Scoring:  The mid and distal anterior septum, mid inferoseptal segment, apical  inferior  segment, and apex are akinetic. The apical anterior segment is  hypokinetic.  The anterior wall, entire  lateral wall, inferior wall, basal anteroseptal  segment, and basal inferoseptal segment are normal.   Right Ventricle: The right ventricular size is normal. No increase in  right ventricular wall thickness. Right ventricular systolic function is  normal. Tricuspid regurgitation signal is inadequate for assessing PA   pressure.   Left Atrium: Left atrial size was normal in size.   Right Atrium: Right atrial size was normal in size.   Pericardium: There is no evidence of pericardial effusion. Presence of  epicardial fat layer.   Mitral Valve: The mitral valve is grossly normal. Trivial mitral valve  regurgitation.   Tricuspid Valve: The tricuspid valve is grossly normal. Tricuspid valve  regurgitation is trivial.   Aortic Valve: The aortic valve is tricuspid. There is mild to moderate  aortic valve annular calcification. Aortic valve regurgitation is not  visualized.   Pulmonic Valve: The pulmonic valve was not well visualized. Pulmonic valve  regurgitation is trivial.   Aorta: The aortic root is normal in size and structure.   Venous: Unable to estimate CVP. The inferior vena cava was not well  visualized.   IAS/Shunts: No atrial level shunt detected by color flow Doppler.   _____________   History of Present Illness     Seth [name] is a 62 y.o. male with a hx of HTN, BPH and active smoker who is being seen 06/28/2023 for the evaluation of chest pain at the request of IM. Seth [name] stated that on Friday night he developed sudden onset of left sided chest pain after a a cough spell. He described the pain as a dull sensation, 7/10 without significant relievers. He was able to sleep through it but woke up Saturday morning with no improvement in symptoms. Shortly after he developed nausea and decided to go to the ER.  Found to have elevated high-sensitivity troponin with concerns for non-STEMI.  He was admitted for further evaluation with plans for cardiac catheterization.  Hospital Course     Non-STEMI -- Underwent cardiac catheterization noted above on 7/7 with successful PCI/DES x 1 to mid LAD and DES x 1 to diagonal.  Moderate nonobstructive residual disease in mid LAD to be treated medically.  Noted to have elevated elevated LV filling pressures and was given IV Lasix while in the Cath  Lab.  Recommendations for DAPT with aspirin/Brilinta for at least 1 year.  Seen by cardiac rehab.  No recurrent chest pain. -- Continue aspirin, Brilinta, losartan 25 mg daily, Coreg 6.25 mg twice daily, atorvastatin 80 mg daily -- Of note he is currently uninsured, medication assistance forms were submitted.  If he is unable to obtain his Brilinta he will need to be transition to Plavix with a 300 mg load and 75 mg daily afterwards  HFmrEF -- Echo showed LVEF of 45 to 50%, grade 1 diastolic dysfunction, normal RV, mid/distal anterior, apical inferior, and apex were akinetic, hypokinesis of the apical anterior segment. -- GDMT: Continue Coreg 6.25 mg twice daily, losartan 25 mg daily, Jardiance 10 mg daily  Hyperlipidemia -- LDL 65, HDL 14 -- Continue atorvastatin 80 mg daily -- Need LFT/FLP in 8 weeks  Diabetes, type II -- Hemoglobin A1c 6.8 -- He prefers to continue with dietary modification -- f/u with PCP as an outpatient  Tobacco use -- Cessation advised  Patient was seen by Dr. Allyson Sabal and deemed stable for discharge home.  Follow-up arranged in the office.  Medication sent to [facility] pharmacy.  Educated by [employer].D. prior to discharge.  Did  the patient have an acute coronary syndrome (MI, NSTEMI, STEMI, etc) this admission?:  Yes                               AHA/ACC Clinical Performance & Quality Measures: Aspirin prescribed? - Yes ADP Receptor Inhibitor (Plavix/Clopidogrel, Brilinta/Ticagrelor or Effient/Prasugrel) prescribed (includes medically managed patients)? - Yes Beta Blocker prescribed? - Yes High Intensity Statin (Lipitor 40-80mg  or Crestor 20-40mg ) prescribed? - Yes EF assessed during THIS hospitalization? - Yes For EF <40%, was ACEI/ARB prescribed? - Yes For EF <40%, Aldosterone Antagonist (Spironolactone or Eplerenone) prescribed? - Not Applicable (EF >/= 40%) Cardiac Rehab Phase II ordered (including medically managed patients)? - Yes    The patient will be  scheduled for a TOC follow up appointment in 10-14 days.  A message has been sent to the [facility] and Scheduling Pool at the office where the patient should be seen for follow up.  _____________  Discharge Vitals Blood pressure 110/65, pulse 66, temperature 98.5 F (36.9 C), temperature source Oral, resp. rate 20, height 6\' 3"  (1.905 m), weight 98.2 kg, SpO2 93 %.  Filed Weights   06/28/23 0342 06/29/23 0500 06/30/23 0500  Weight: 100.5 kg 98.6 kg 98.2 kg    Labs & Radiologic Studies    CBC Recent Labs    06/29/23 0818 06/30/23 0151  WBC 14.0* 13.2*  HGB 14.6 14.6  HCT 43.2 42.9  MCV 86.4 87.2  PLT 312 311   Basic Metabolic Panel Recent Labs    40/98/11 0937 06/30/23 0151  NA 136 133*  K 3.2* 3.6  CL 101 101  CO2 25 23  GLUCOSE 137* 112*  BUN 12 15  CREATININE 0.87 0.89  CALCIUM 9.0 8.6*  MG 2.3  --    Liver Function Tests No results for input(s): "AST", "ALT", "ALKPHOS", "BILITOT", "PROT", "ALBUMIN" in the last 72 hours. No results for input(s): "LIPASE", "AMYLASE" in the last 72 hours. High Sensitivity Troponin:   Recent Labs  Lab 06/27/23 0757 06/27/23 0940  TROPONINIHS 242* 1,112*    BNP Invalid input(s): "POCBNP" D-Dimer No results for input(s): "DDIMER" in the last 72 hours. Hemoglobin A1C No results for input(s): "HGBA1C" in the last 72 hours. Fasting Lipid Panel Recent Labs    06/29/23 0357  CHOL 98  HDL 14*  LDLCALC 65  TRIG 95  CHOLHDL 7.0   Thyroid Function Tests No results for input(s): "TSH", "T4TOTAL", "T3FREE", "THYROIDAB" in the last 72 hours.  Invalid input(s): "FREET3" _____________  ECHOCARDIOGRAM COMPLETE  Result Date: 06/28/2023    ECHOCARDIOGRAM REPORT   Patient Name:   Seth [name] Date of Exam: 06/28/2023 Medical Rec #:  [id number]        Height:       75.0 in Accession #:    [id number]       Weight:       221.6 lb Date of Birth:  [birth year]       BSA:          2.292 m Patient Age:    61 years         BP:            101/89 mmHg Patient Gender: M                HR:           72 bpm. Exam Location:  Inpatient Procedure: 2D Echo, Color Doppler and Cardiac Doppler  Indications:    R07.9* Chest pain, unspecified  History:        Patient has no prior history of Echocardiogram examinations.                 CAD.  Sonographer:    Irving Burton Senior RDCS Referring Phys: [id number] CHRISTOPHER D MCALHANY IMPRESSIONS  1. Left ventricular ejection fraction, by estimation, is 45 to 50%. The left ventricle has mildly decreased function. The left ventricle demonstrates regional wall motion abnormalities (see scoring diagram/findings for description). There is mild concentric left ventricular hypertrophy. Left ventricular diastolic parameters are consistent with Grade I diastolic dysfunction (impaired relaxation).  2. Right ventricular systolic function is normal. The right ventricular size is normal. Tricuspid regurgitation signal is inadequate for assessing PA pressure.  3. The mitral valve is grossly normal. Trivial mitral valve regurgitation.  4. The aortic valve is tricuspid. Aortic valve regurgitation is not visualized.  5. Unable to estimate CVP. Comparison(s): No prior Echocardiogram. FINDINGS  Left Ventricle: Left ventricular ejection fraction, by estimation, is 45 to 50%. The left ventricle has mildly decreased function. The left ventricle demonstrates regional wall motion abnormalities. The left ventricular internal cavity size was normal in size. There is mild concentric left ventricular hypertrophy. Left ventricular diastolic parameters are consistent with Grade I diastolic dysfunction (impaired relaxation).  LV Wall Scoring: The mid and distal anterior septum, mid inferoseptal segment, apical inferior segment, and apex are akinetic. The apical anterior segment is hypokinetic. The anterior wall, entire lateral wall, inferior wall, basal anteroseptal segment, and basal inferoseptal segment are normal. Right Ventricle: The right ventricular size  is normal. No increase in right ventricular wall thickness. Right ventricular systolic function is normal. Tricuspid regurgitation signal is inadequate for assessing PA pressure. Left Atrium: Left atrial size was normal in size. Right Atrium: Right atrial size was normal in size. Pericardium: There is no evidence of pericardial effusion. Presence of epicardial fat layer. Mitral Valve: The mitral valve is grossly normal. Trivial mitral valve regurgitation. Tricuspid Valve: The tricuspid valve is grossly normal. Tricuspid valve regurgitation is trivial. Aortic Valve: The aortic valve is tricuspid. There is mild to moderate aortic valve annular calcification. Aortic valve regurgitation is not visualized. Pulmonic Valve: The pulmonic valve was not well visualized. Pulmonic valve regurgitation is trivial. Aorta: The aortic root is normal in size and structure. Venous: Unable to estimate CVP. The inferior vena cava was not well visualized. IAS/Shunts: No atrial level shunt detected by color flow Doppler.  LEFT VENTRICLE PLAX 2D LVIDd:         4.60 cm      Diastology LVIDs:         2.90 cm      LV e' medial:    5.00 cm/s LV PW:         1.20 cm      LV E/e' medial:  7.1 LV IVS:        1.20 cm      LV e' lateral:   5.55 cm/s LVOT diam:     2.20 cm      LV E/e' lateral: 6.4 LV SV:         75 LV SV Index:   33 LVOT Area:     3.80 cm  LV Volumes (MOD) LV vol d, MOD A2C: 122.0 ml LV vol d, MOD A4C: 169.0 ml LV vol s, MOD A2C: 59.3 ml LV vol s, MOD A4C: 78.4 ml LV SV MOD A2C:     62.7 ml  LV SV MOD A4C:     169.0 ml LV SV MOD BP:      78.0 ml RIGHT VENTRICLE RV S prime:     16.90 cm/s TAPSE (M-mode): 2.6 cm LEFT ATRIUM             Index        RIGHT ATRIUM           Index LA diam:        4.00 cm 1.75 cm/m   RA Area:     15.30 cm LA Vol (A2C):   54.1 ml 23.60 ml/m  RA Volume:   37.60 ml  16.40 ml/m LA Vol (A4C):   59.0 ml 25.74 ml/m LA Biplane Vol: 60.4 ml 26.35 ml/m  AORTIC VALVE LVOT Vmax:   105.00 cm/s LVOT Vmean:  82.700  cm/s LVOT VTI:    0.197 m  AORTA Ao Root diam: 3.30 cm Ao Asc diam:  3.70 cm MITRAL VALVE MV Area (PHT): 3.70 cm    SHUNTS MV Decel Time: 205 msec    Systemic VTI:  0.20 m MV E velocity: 35.60 cm/s  Systemic Diam: 2.20 cm MV A velocity: 65.00 cm/s MV E/A ratio:  0.55 Nona Dell MD Electronically signed by Nona Dell MD Signature Date/Time: 06/28/2023/2:42:04 PM    Final    CARDIAC CATHETERIZATION  Result Date: 06/28/2023   Prox RCA to Mid RCA lesion is 20% stenosed.   Prox Cx lesion is 20% stenosed.   Prox LAD to Mid LAD lesion is 99% stenosed.   1st Diag lesion is 99% stenosed.   Mid LAD lesion is 50% stenosed.   A drug-eluting stent was successfully placed using a SYNERGY XD 3.0X12.   A drug-eluting stent was successfully placed using a SYNERGY XD 3.0X16.   Post intervention, there is a 0% residual stenosis.   Post intervention, there is a 0% residual stenosis. Acute anterolateral STEMI secondary to thrombotic sub-total occlusion of the mid LAD and large Diagonal branch. Successful PTCA/DES x 1 mid LAD Successful PTCA/DES x 1 Diagonal Moderate non-obstructive residual disease in the mid LAD beyond the stented segment Mild non-obstructive disease in the moderate caliber Circumflex and in the large dominant RCA Elevated LV filling pressure-40 mg IV Lasix given in the cath lab (LV 148/19/32) LV gram with hypokinesis of the anterior and apical walls. Recommendations: Will transfer to the ICU. Echo today. Continue Aggrastat for 2 hours. DAPT with ASA and Brilinta for one year. High intensity statin. I anticipate that he will need to be on a beta blocker but given soft BP post cath will not start today. He was given IV Lasix in the cath lab. Will write for an additional 40 mg IV lasix tonight.   CT Angio Chest PE W and/or Wo Contrast  Result Date: 06/27/2023 CLINICAL DATA:  Chest pain EXAM: CT ANGIOGRAPHY CHEST WITH CONTRAST TECHNIQUE: Multidetector CT imaging of the chest was performed using the standard  protocol during bolus administration of intravenous contrast. Multiplanar CT image reconstructions and MIPs were obtained to evaluate the vascular anatomy. RADIATION DOSE REDUCTION: This exam was performed according to the departmental dose-optimization program which includes automated exposure control, adjustment of the mA and/or kV according to patient size and/or use of iterative reconstruction technique. CONTRAST:  75mL OMNIPAQUE IOHEXOL 350 MG/ML SOLN COMPARISON:  No comparison studies available. FINDINGS: Cardiovascular: The heart size is normal. No substantial pericardial effusion. No thoracic aortic aneurysm. No substantial atherosclerosis of the thoracic aorta. There is no filling  defect within the opacified pulmonary arteries to suggest the presence of an acute pulmonary embolus. Mediastinum/Nodes: No mediastinal lymphadenopathy. There is no hilar lymphadenopathy. The esophagus has normal imaging features. There is no axillary lymphadenopathy. Lungs/Pleura: Centrilobular emphsyema noted. Dependent ground-glass attenuation in both lower lobes is likely atelectatic. No dense focal consolidative airspace disease. No suspicious pulmonary nodule or mass. No pleural effusion. Upper Abdomen: The liver shows diffusely decreased attenuation suggesting fat deposition. Layering tiny calcified gallstones evident. Musculoskeletal: No worrisome lytic or sclerotic osseous abnormality. Review of the MIP images confirms the above findings. IMPRESSION: 1. No CT evidence for acute pulmonary embolus. 2. Hepatic steatosis. 3. Cholelithiasis. 4.  Emphysema. ([id number]) Electronically Signed   By: Kennith Center M.D.   On: 06/27/2023 11:02   DG Chest 2 View  Result Date: 06/27/2023 CLINICAL DATA:  Chest pain EXAM: CHEST - 2 VIEW COMPARISON:  07/29/2011 FINDINGS: Heart size and mediastinal contours are normal. No pleural fluid or interstitial edema. No airspace opacities identified. Coarsened interstitial markings in central  airway thickening identified bilaterally. IMPRESSION: Central airway thickening and coarsened interstitial markings may reflect bronchitis or reactive airways disease. No focal pneumonia. Electronically Signed   By: Signa Kell M.D.   On: 06/27/2023 08:17   Disposition   Pt is being discharged home today in good condition.  Follow-up Plans & Appointments     Follow-up Information     [place], O'Laf, PA-C Follow up on 07/09/2023.   Specialty: Physician Assistant Why: @ 2:30 pm for hospital follow up appointment, arrive 15 min early, bring proof of ID and income, and list of medications. Please call the offiice to cancel if you cannot make this scheduled appointment. Contact information: [address] [place] [address] [phone]         Sharlene Dory, NP Follow up on 07/13/2023.   Specialty: Cardiology Why: at 1:30pm for your follow up with cardiology Contact information: [address] [place] [address] [phone]                Discharge Instructions     Amb Referral to Cardiac Rehabilitation   Complete by: As directed    Diagnosis:  Coronary Stents STEMI PTCA     After initial evaluation and assessments completed: Virtual Based Care may be provided alone or in conjunction with Phase 2 Cardiac Rehab based on patient barriers.: Yes   Intensive Cardiac Rehabilitation (ICR) [facility] location only OR Traditional Cardiac Rehabilitation (TCR) *If criteria for ICR are not met will enroll in TCR [facility] only): Yes   Ambulatory Referral for Lung Cancer Scre   Complete by: As directed    Call MD for:  difficulty breathing, headache or visual disturbances   Complete by: As directed    Call MD for:  persistant dizziness or light-headedness   Complete by: As directed    Call MD for:  redness, tenderness, or signs of infection (pain, swelling, redness, odor or green/yellow discharge around incision site)   Complete by: As directed    Diet - low sodium  heart healthy   Complete by: As directed    Discharge instructions   Complete by: As directed    Radial Site Care Refer to this sheet in the next few weeks. These instructions provide you with information on caring for yourself after your procedure. Your caregiver may also give you more specific instructions. Your treatment has been planned according to current medical practices, but problems sometimes occur. Call your caregiver  if you have any problems or questions after your procedure. HOME CARE INSTRUCTIONS You may shower the day after the procedure. Remove the bandage (dressing) and gently wash the site with plain soap and water. Gently pat the site dry.  Do not apply powder or lotion to the site.  Do not submerge the affected site in water for 3 to 5 days.  Inspect the site at least twice daily.  Do not flex or bend the affected arm for 24 hours.  No lifting over 5 pounds (2.3 kg) for 5 days after your procedure.  Do not drive home if you are discharged the same day of the procedure. Have someone else drive you.  You may drive 24 hours after the procedure unless otherwise instructed by your caregiver.  What to expect: Any bruising will usually fade within 1 to 2 weeks.  Blood that collects in the tissue (hematoma) may be painful to the touch. It should usually decrease in size and tenderness within 1 to 2 weeks.  SEEK IMMEDIATE MEDICAL CARE IF: You have unusual pain at the radial site.  You have redness, warmth, swelling, or pain at the radial site.  You have drainage (other than a small amount of blood on the dressing).  You have chills.  You have a fever or persistent symptoms for more than 72 hours.  You have a fever and your symptoms suddenly get worse.  Your arm becomes pale, cool, tingly, or numb.  You have heavy bleeding from the site. Hold pressure on the site.   PLEASE DO NOT MISS ANY DOSES OF YOUR BRILINTA!!!!! Also keep a log of you blood pressures and bring back to your  follow up appt. Please call the office with any questions.   Patients taking blood thinners should generally stay away from medicines like ibuprofen, Advil, Motrin, naproxen, and Aleve due to risk of stomach bleeding. You may take Tylenol as directed or talk to your primary doctor about alternatives.  PLEASE ENSURE THAT YOU DO NOT RUN OUT OF YOUR BRILINTA. This medication is very important to remain on for at least one year. IF you have issues obtaining this medication due to cost please CALL the office 3-5 business days prior to running out in order to prevent missing doses of this medication.   Increase activity slowly   Complete by: As directed        Discharge Medications   Allergies as of 06/30/2023       Reactions   Penicillins Other (See Comments)   Childhood allergy        Medication List     TAKE these medications    aspirin EC 81 MG tablet Take 1 tablet (81 mg total) by mouth daily. Swallow whole. Start taking on: July 01, 2023   atorvastatin 80 MG tablet Commonly known as: Lipitor Take 1 tablet (80 mg total) by mouth daily.   carvedilol 6.25 MG tablet Commonly known as: COREG Take 1 tablet (6.25 mg total) by mouth 2 (two) times daily with a meal.   empagliflozin 10 MG Tabs tablet Commonly known as: JARDIANCE Take 1 tablet (10 mg total) by mouth daily. Start taking on: July 01, 2023   ibuprofen 200 MG tablet Commonly known as: ADVIL Take 200 mg by mouth every 6 (six) hours as needed for headache or mild pain.   losartan 25 MG tablet Commonly known as: COZAAR Take 1 tablet (25 mg total) by mouth daily.   multivitamin with minerals Tabs tablet Take  1 tablet by mouth daily.   nitroGLYCERIN 0.4 MG SL tablet Commonly known as: NITROSTAT Place 1 tablet (0.4 mg total) under the tongue every 5 (five) minutes x 3 doses as needed for chest pain.   PROSTATE PO Take 1 tablet by mouth daily.   ticagrelor 90 MG Tabs tablet Commonly known as: BRILINTA Take 1  tablet (90 mg total) by mouth 2 (two) times daily.       Outstanding Labs/Studies   FLP/LFTs in 8 weeks BMET at follow up  Duration of Discharge Encounter   Greater than 30 minutes including physician time.  Signed, Laverda Page, NP 06/30/2023, 10:57 AM  Agree with note by Laverda Page NP-C  Patient mated with anterolateral non-STEMI on 06/28/2023 and underwent urgent PCI" T stenting of the LAD diagonal branch bifurcation by Dr. [place].  The remainder of his anatomy was free of obstructive disease.  He did have mild to moderate LV dysfunction.  His other problems include treated hyperlipidemia, type 2 diabetes and tobacco abuse.  We talked about risk factor modification including smoking cessation and medication compliance.  He is on appropriate medications for GDMT.  He wishes to be followed up in [place] where he lives.  Will arrange for TOC 7 after which we will have to see a cardiologist in [facility] in [place] can follow-up in 6 to 8 weeks.  Runell Gess, M.D., FACP, [facility], [facility] [address] [place], [place]  [address]  [phone] 06/30/2023 11:39 AM

## 2023-06-30 NOTE — Telephone Encounter (Signed)
   Transition of Care Follow-up Phone Call Request    Patient Name: Seth [name] Date of Birth: 19-Aug-1961 Date of Encounter: 06/30/2023  Primary Care Provider:  Gareth Morgan, MD Primary Cardiologist:  None  Elsie Amis Riemenschneider has been scheduled for a transition of care follow up appointment with a Heart[facility] provider:  Sharlene Dory 7/22  Please reach out to [name] within 48 hours to confirm appointment and review transition of care protocol questionnaire.  Laverda Page, NP  06/30/2023, 10:20 AM

## 2023-06-30 NOTE — Progress Notes (Signed)
Explained discharge instructions to patient. Reviewed follow up appointment and next medication administration times. Also reviewed education. Patient verbalized having an understanding for instructions given. All belongings are in the patient's possession. Will wait in the discharge lounge for his ride and TOC meds. Removed both IVs and telemetry. CCMD was notified. No other needs verbalized. Transported downstairs for discharge.

## 2023-06-30 NOTE — Care Management (Signed)
06-30-23 1040 MATCH completed for this patient. Medications will be delivered to the bedside via [facility]. No further needs identified at this time.

## 2023-07-01 NOTE — Telephone Encounter (Signed)
Spoke with Patient he is aware of his upcoming appointment with Sharlene Dory on 7/22 at 1:30PM

## 2023-07-13 ENCOUNTER — Encounter: Payer: Self-pay | Admitting: Nurse Practitioner

## 2023-07-13 ENCOUNTER — Ambulatory Visit: Payer: Medicaid Other | Attending: Nurse Practitioner | Admitting: Nurse Practitioner

## 2023-07-13 VITALS — BP 138/82 | HR 60 | Ht 75.0 in | Wt 221.2 lb

## 2023-07-13 DIAGNOSIS — R030 Elevated blood-pressure reading, without diagnosis of hypertension: Secondary | ICD-10-CM

## 2023-07-13 DIAGNOSIS — I25119 Atherosclerotic heart disease of native coronary artery with unspecified angina pectoris: Secondary | ICD-10-CM | POA: Diagnosis not present

## 2023-07-13 DIAGNOSIS — Z79899 Other long term (current) drug therapy: Secondary | ICD-10-CM

## 2023-07-13 DIAGNOSIS — E785 Hyperlipidemia, unspecified: Secondary | ICD-10-CM

## 2023-07-13 DIAGNOSIS — Z87891 Personal history of nicotine dependence: Secondary | ICD-10-CM

## 2023-07-13 DIAGNOSIS — Z758 Other problems related to medical facilities and other health care: Secondary | ICD-10-CM

## 2023-07-13 DIAGNOSIS — I5022 Chronic systolic (congestive) heart failure: Secondary | ICD-10-CM | POA: Diagnosis not present

## 2023-07-13 DIAGNOSIS — I214 Non-ST elevation (NSTEMI) myocardial infarction: Secondary | ICD-10-CM | POA: Diagnosis not present

## 2023-07-13 MED ORDER — LOSARTAN POTASSIUM 25 MG PO TABS: 25.0000 mg | ORAL_TABLET | Freq: Every day | ORAL | 1 refills | Status: AC

## 2023-07-13 MED ORDER — CLOPIDOGREL BISULFATE 75 MG PO TABS: 75.0000 mg | ORAL_TABLET | Freq: Every day | ORAL | 2 refills | Status: AC

## 2023-07-13 MED ORDER — ATORVASTATIN CALCIUM 80 MG PO TABS: 80.0000 mg | ORAL_TABLET | Freq: Every day | ORAL | 1 refills | Status: AC

## 2023-07-13 NOTE — Progress Notes (Unsigned)
Cardiology Office Note:  .   Date:  07/13/2023 ID:  Seth [name], DOB [birth year], MRN [id number] PCP: Gareth Morgan, MD  [facility] Providers Cardiologist:  Nanetta Batty, MD    History of Present Illness: .   Seth [name] is a 62 y.o. male with a PMH of CAD, s/p NSTEMI (06/2023), HFmrEF, HLD, T2DM, tobacco use, and BPH who presents today for cardiac cath/PCI follow-up.   Recent hospital admission 06/2023 for chest pain, was found to have elevated high sensitivity troponin with concerns for NSTEMI. Underwent cardiac cath on 7/7/and received successful PCI/DES x 1 to mLAD and DES x 1 to diagonal. Moderate nonobstructive residual disease along mLAD was recommended to be treated medically. Was also given IV Lasix in Cath Lab for elevated LV filling pressures. Recommended for DAPT with ASA/Brilinta x 1 year. TTE EF 45-50%, grade 1 DD.   Today he presents for TOC follow-up. He states he is doing better since leaving the hospital.  Currently does not have health insurance, and therefore cannot afford Jardiance.  States he is currently taking Brilinta and will soon switch to Plavix when Brilinta runs out. He has been instructed on how to do this. Does admit to mild chest pain after heart cath, not as severe as when he presented to the ED. Does notice some more fatigue and shortness of breath with exertion since hospital d/c. Denies any palpitations, syncope, presyncope, dizziness, orthopnea, PND, swelling or significant weight changes, acute bleeding, or claudication.  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  SH: Works at [employer]   Studies Reviewed: .    Echo 06/2023:  1. Left ventricular ejection fraction, by estimation, is 45 to 50%. The  left ventricle has mildly decreased function. The left ventricle  demonstrates  regional wall motion abnormalities (see scoring  diagram/findings for description). There is mild  concentric left ventricular hypertrophy. Left ventricular diastolic  parameters are consistent with Grade I diastolic dysfunction (impaired  relaxation).   2. Right ventricular systolic function is normal. The right ventricular  size is normal. Tricuspid regurgitation signal is inadequate for assessing  PA pressure.   3. The mitral valve is grossly normal. Trivial mitral valve  regurgitation.   4. The aortic valve is tricuspid. Aortic valve regurgitation is not  visualized.   5. Unable to estimate CVP.   Comparison(s): No prior Echocardiogram.  LHC 06/2023:    Prox RCA to Mid RCA lesion is 20% stenosed.   Prox Cx lesion is 20% stenosed.   Prox LAD to Mid LAD lesion is 99% stenosed.   1st Diag lesion is 99% stenosed.   Mid LAD lesion is 50% stenosed.   A drug-eluting stent was successfully placed using a SYNERGY XD 3.0X12.   A drug-eluting stent was successfully placed using a SYNERGY XD 3.0X16.   Post intervention, there is a 0% residual stenosis.   Post intervention, there is a 0% residual stenosis.   Acute anterolateral STEMI secondary to thrombotic sub-total occlusion of the mid LAD and large Diagonal branch.  Successful PTCA/DES x 1 mid LAD Successful PTCA/DES x 1 Diagonal Moderate non-obstructive residual disease in the mid LAD beyond the stented segment Mild non-obstructive disease in the moderate caliber Circumflex and in the large dominant RCA Elevated LV filling pressure-40 mg IV Lasix given in the cath lab (LV 148/19/32) LV gram  with hypokinesis of the anterior and apical walls.    Recommendations: Will transfer to the ICU. Echo today. Continue Aggrastat for 2 hours. DAPT with ASA and Brilinta for one year. High intensity statin. I anticipate that he will need to be on a beta blocker but given soft BP post cath will not start today. He was given IV Lasix in the cath lab. Will  write for an additional 40 mg IV lasix tonight.   Physical Exam:   VS:  BP 138/82   Pulse 60   Ht 6\' 3"  (1.905 m)   Wt 221 lb 3.2 oz (100.3 kg)   SpO2 95%   BMI 27.65 kg/m    Wt Readings from Last 3 Encounters:  07/13/23 221 lb 3.2 oz (100.3 kg)  06/30/23 216 lb 6.4 oz (98.2 kg)  03/13/18 230 lb (104.3 kg)    GEN: Well nourished, well developed in no acute distress NECK: No JVD; No carotid bruits CARDIAC: S1/S2, RRR, no murmurs, rubs, gallops RESPIRATORY:  Clear to auscultation without rales, wheezing or rhonchi  ABDOMEN: Soft, non-tender, non-distended EXTREMITIES:  No edema; No deformity   ASSESSMENT AND PLAN: .    CAD, s/p NSTEMI, medication management Mild chest pains post cath, not bothersome per his report. Underwent cardiac cath and received successful PCI/DES x 1 to mLAD and DES x 1 to diagnonal.  Moderate nonobstructive residual disease in mid LAD to be treated medically.  Continue aspirin and Brilinta until current Brilinta prescription runs out, then switch to Plavix.  Will send in another prescription bottle of Plavix at 75 mg daily.  He states he has already been instructed on how to do this by another medical personnel. Will send in refill of losartan and atorvastatin at this time. Will obtain CBC and BMET in 1 week. Continue rest of medication regimen. Heart healthy diet and regular cardiovascular exercise encouraged. Okay to start cardiac rehab. ED precautions discussed.     Cardiac Rehabilitation Eligibility Assessment  The patient is ready to start cardiac rehabilitation from a cardiac standpoint.    2. HFmrEF Stage C, NYHA class II symptoms.  TTE 06/2023 revealed EF 45 to 50%. Euvolemic and well compensated on exam.  Continue carvedilol, Jardiance -will establish patient with patient assistance program, and losartan. Low sodium diet, fluid restriction <2L, and daily weights encouraged. Educated to contact our office for weight gain of 2 lbs overnight or 5 lbs in one  week.  Will provide refills per his request.  3. HLD Tolerating Lipitor well.  Continue atorvastatin.  At next office visit plan to obtain FLP and LFT per protocol. Heart healthy diet and regular cardiovascular exercise encouraged.   4. Elevated BP Blood pressure mildly elevated today.  Discussed SBP goal less than 130.  No medication changes at this time. Discussed to monitor BP at home at least 2 hours after medications and sitting for 5-10 minutes. Heart healthy diet and regular cardiovascular exercise encouraged.   5. Former Smoker States he has stopped smoking.  I congratulated him.   6.  Does not have a primary care provider Currently does not have a PCP, he is requesting a referral to primary care.  Will provide this referral for him.  Dispo: Follow-up with me or APP in 6 weeks or sooner if anything changes.   Signed, Sharlene Dory, NP

## 2023-07-13 NOTE — Patient Instructions (Addendum)
Medication Instructions:  Your physician has recommended you make the following change in your medication:  Stop taking Brilinta when you run our Start taking Plavix 75 Mg daily  Continue all other medications as prescribed   Labwork: CBC & BMET in 2 weeks at [facility]  Testing/Procedures: none  Follow-Up: Your physician recommends that you schedule a follow-up appointment in: 6 weeks [name]   Any Other Special Instructions Will Be Listed Below (If Applicable). Patient Assistance give for Jardiance   If you need a refill on your cardiac medications before your next appointment, please call your pharmacy.

## 2023-07-16 ENCOUNTER — Telehealth: Payer: Self-pay | Admitting: Cardiovascular Disease

## 2023-07-16 DIAGNOSIS — Z0279 Encounter for issue of other medical certificate: Secondary | ICD-10-CM

## 2023-07-16 NOTE — Telephone Encounter (Signed)
[employer] is calling to get surgery information for patient. Requesting call back.

## 2023-07-20 ENCOUNTER — Telehealth: Payer: Self-pay

## 2023-07-20 NOTE — Telephone Encounter (Signed)
Requested by D. Early to call and follow up with [facility] client that has established primary medical care with [facility]. His last appointment was 07/09/23 and his next appointment is scheduled for 07/23/23.  Reason for call is that he had question regarding two of his medications that the Health Department were going to help him obtain due to cost. Jardiance and changing Brillenta to Plavis.  Called [facility] pharmacy and talked with Elisabeth Pigeon [facility], she states client picked up his plavix on 07/09/23 and he was to continue Brillenta until he finished that and then start Plavix. Client states yes he did pick that up and is aware of the directions. Jardiance, per Elisabeth Pigeon that signed him up for PAP program to receive in the mail and that was submitted on 07/14/23 she said once they receive the approval letter they would then send the prescriptions to be filled for 3 month supply at a time. Discussed this with client and that pharmacy states it can be a 2-3 week process. Client states he has some Jardiance from the last time it was filled and should last into August. He states he has all the other medications and can afford those at this time. He is hoping he will be allowed to go back to work soon.  Reviewed his appointments. He has [facility] appointment on 09/14/23 in [place]. Noted that he has also been scheduled to establish primary care with [facility] on 08/20/23 we discussed that he cannot have two primary cares and that he has already established with the Health Department and at this time using the pharmacy at this time. He states understanding. Discussed that he was sent a MyChart message with this information on 07/15/23 and the phone number is in the message. Recommended he may call and discuss out of pocket copayment and to let them know if he plans to cancel and stay with [facility]. He states at this time he will remain with Health Department due to the cost of medications.  No  further needs identified today, will continue to follow as needed.   Francee Nodal RN [employer]

## 2023-08-03 ENCOUNTER — Telehealth: Payer: Self-pay | Admitting: Nurse Practitioner

## 2023-08-03 DIAGNOSIS — Z0279 Encounter for issue of other medical certificate: Secondary | ICD-10-CM

## 2023-08-03 NOTE — Telephone Encounter (Signed)
Disability forms received from Patient on 08/03/23. Authorization and Billing forms completed, scanned to chart and sent to billing department. Forms given to Sharlene Dory for completion.   AA 08/03/23

## 2023-08-06 NOTE — Telephone Encounter (Signed)
[employer] Physician Statement completed and faxed to [employer]  It has been scanned  Will send billing charge to billing

## 2023-08-06 NOTE — Telephone Encounter (Signed)
FMLA forms completed and faxed to [facility]  Will send billing form to billing to have charged placed.

## 2023-08-13 NOTE — Telephone Encounter (Signed)
Patient stated he will need to be released to go back to work.

## 2023-08-14 ENCOUNTER — Encounter: Payer: Self-pay | Admitting: *Deleted

## 2023-08-14 NOTE — Telephone Encounter (Signed)
Patient informed and verbalized understanding of plan. Work note updated and left up front [employer]

## 2023-08-20 ENCOUNTER — Encounter: Payer: Self-pay | Admitting: Family Medicine

## 2023-08-20 ENCOUNTER — Ambulatory Visit (INDEPENDENT_AMBULATORY_CARE_PROVIDER_SITE_OTHER): Payer: Medicaid Other | Admitting: Family Medicine

## 2023-08-20 VITALS — BP 121/80 | HR 77 | Ht 75.0 in | Wt 220.1 lb

## 2023-08-20 DIAGNOSIS — Z125 Encounter for screening for malignant neoplasm of prostate: Secondary | ICD-10-CM

## 2023-08-20 DIAGNOSIS — Z0100 Encounter for examination of eyes and vision without abnormal findings: Secondary | ICD-10-CM

## 2023-08-20 DIAGNOSIS — E559 Vitamin D deficiency, unspecified: Secondary | ICD-10-CM

## 2023-08-20 DIAGNOSIS — Z114 Encounter for screening for human immunodeficiency virus [HIV]: Secondary | ICD-10-CM

## 2023-08-20 DIAGNOSIS — E119 Type 2 diabetes mellitus without complications: Secondary | ICD-10-CM

## 2023-08-20 DIAGNOSIS — E538 Deficiency of other specified B group vitamins: Secondary | ICD-10-CM

## 2023-08-20 DIAGNOSIS — I10 Essential (primary) hypertension: Secondary | ICD-10-CM | POA: Diagnosis not present

## 2023-08-20 DIAGNOSIS — Z1159 Encounter for screening for other viral diseases: Secondary | ICD-10-CM

## 2023-08-20 DIAGNOSIS — Z1211 Encounter for screening for malignant neoplasm of colon: Secondary | ICD-10-CM

## 2023-08-20 DIAGNOSIS — Z7984 Long term (current) use of oral hypoglycemic drugs: Secondary | ICD-10-CM

## 2023-08-20 MED ORDER — TAMSULOSIN HCL 0.4 MG PO CAPS: 0.4000 mg | ORAL_CAPSULE | Freq: Every day | ORAL | 3 refills | Status: DC

## 2023-08-20 NOTE — Assessment & Plan Note (Signed)
Vitals:   08/20/23 0933  BP: 121/80  Blood pressure controlled in today's visit Continue Losartan 25 mg daily and carvedilol 6.25 twice daily Patient is self pay and denied labs to be done today, he stated he is working on applying for insurance          Explained non pharmacological interventions such as low salt, DASH diet discussed. Educated on stress reduction and physical activity minimum 150 minutes per week. Discussed signs and symptoms of major cardiovascular event and need to present to the ED. Follow up in 3 months or sooner if needed. Patient verbalizes understanding regarding plan of care and all questions answered.

## 2023-08-20 NOTE — Patient Instructions (Signed)

## 2023-08-20 NOTE — Progress Notes (Signed)
New Patient Office Visit   Subjective   Patient ID: Seth [name], male    DOB: Sep 20, 1961  Age: 62 y.o. MRN: [id number]  CC:  Chief Complaint  Patient presents with   Establish Care    HPI Seth [name] 62 year old male, presents to establish care. He  has a past medical history of BPH (benign prostatic hyperplasia), Hyperlipidemia, Hypertension, NSTEMI (non-ST elevated myocardial infarction) (HCC), PONV (postoperative nausea and vomiting), and Type 2 diabetes mellitus (HCC).   Patient here for HTN management. He is not exercising and is not adherent to low salt diet.  Blood pressure unknown if controlled at home. Patient denies cardiac symptoms chest pain, dyspnea, fatigue, lower extremity edema, and tachypnea. Cardiovascular risk factors: Recent NSTEMI, advanced age (older than 26 for men, 73 for women), diabetes mellitus, dyslipidemia, hypertension, male gender, and sedentary lifestyle.     Outpatient Encounter Medications as of 08/20/2023  Medication Sig   aspirin EC 81 MG tablet Take 1 tablet (81 mg total) by mouth daily. Swallow whole.   atorvastatin (LIPITOR) 80 MG tablet Take 1 tablet (80 mg total) by mouth daily.   carvedilol (COREG) 6.25 MG tablet Take 1 tablet (6.25 mg total) by mouth 2 (two) times daily with a meal.   clopidogrel (PLAVIX) 75 MG tablet Take 1 tablet (75 mg total) by mouth daily.   empagliflozin (JARDIANCE) 10 MG TABS tablet Take 1 tablet (10 mg total) by mouth daily.   ibuprofen (ADVIL) 200 MG tablet Take 200 mg by mouth every 6 (six) hours as needed for headache or mild pain.   losartan (COZAAR) 25 MG tablet Take 1 tablet (25 mg total) by mouth daily.   Multiple Vitamin (MULTIVITAMIN WITH MINERALS) TABS tablet Take 1 tablet by mouth daily.   nitroGLYCERIN (NITROSTAT) 0.4 MG SL tablet Place 1 tablet (0.4 mg total) under the tongue every 5 (five) minutes x 3 doses as needed for chest pain.   Nutritional Supplements (PROSTATE PO) Take 1 tablet by mouth  daily.   [DISCONTINUED] tamsulosin (FLOMAX) 0.4 MG CAPS capsule Take 0.4 mg by mouth daily.   tamsulosin (FLOMAX) 0.4 MG CAPS capsule Take 1 capsule (0.4 mg total) by mouth daily.   No facility-administered encounter medications on file as of 08/20/2023.    Past Surgical History:  Procedure Laterality Date   CORONARY/GRAFT ACUTE MI REVASCULARIZATION N/A 06/28/2023   Procedure: Coronary/Graft Acute MI Revascularization;  Surgeon: Kathleene Hazel, MD;  Location: [facility]AS[facility];  Service: Cardiovascular;  Laterality: N/A;   HERNIA REPAIR Right    INGUINAL HERNIA REPAIR Left 02/16/2015   Procedure: LEFT INGUINAL HERNIORRHAPHY;  Surgeon: Dalia Heading, MD;  Location: [facility];  Service: General;  Laterality: Left;   INSERTION OF MESH Left 02/16/2015   Procedure: INSERTION OF MESH;  Surgeon: Dalia Heading, MD;  Location: [facility];  Service: General;  Laterality: Left;   TONSILLECTOMY      Review of Systems  Constitutional:  Negative for chills and fever.  Eyes:  Negative for blurred vision.  Respiratory:  Negative for shortness of breath.   Cardiovascular:  Negative for chest pain.  Gastrointestinal:  Negative for abdominal pain.  Genitourinary:  Negative for dysuria.  Neurological:  Negative for dizziness and headaches.      Objective    BP 121/80 (BP Location: Right Arm, Patient Position: Sitting, Cuff Size: Large)   Pulse 77   Ht 6\' 3"  (1.905 m)   Wt 220 lb 1.9 oz (  99.8 kg)   SpO2 95%   BMI 27.51 kg/m   Physical Exam Vitals reviewed.  Constitutional:      General: He is not in acute distress.    Appearance: Normal appearance. He is not ill-appearing, toxic-appearing or diaphoretic.  HENT:     Head: Normocephalic.  Eyes:     General:        Right eye: No discharge.        Left eye: No discharge.     Conjunctiva/sclera: Conjunctivae normal.  Cardiovascular:     Rate and Rhythm: Normal rate.     Pulses: Normal pulses.     Heart sounds: Normal heart sounds.   Pulmonary:     Effort: Pulmonary effort is normal. No respiratory distress.     Breath sounds: Normal breath sounds.  Abdominal:     General: Bowel sounds are normal.     Palpations: Abdomen is soft.     Tenderness: There is no abdominal tenderness. There is no right CVA tenderness, left CVA tenderness or guarding.  Musculoskeletal:        General: Normal range of motion.     Cervical back: Normal range of motion.  Skin:    General: Skin is warm and dry.     Capillary Refill: Capillary refill takes less than 2 seconds.  Neurological:     General: No focal deficit present.     Mental Status: He is alert.     Coordination: Coordination normal.     Gait: Gait normal.  Psychiatric:        Mood and Affect: Mood normal.        Behavior: Behavior normal.       Assessment & Plan:  Type 2 diabetes mellitus without complication, without long-term current use of insulin (HCC)  Diabetic eye exam (HCC)  Need for hepatitis C screening test  Vitamin D deficiency  Screening for HIV (human immunodeficiency virus)  Vitamin B12 deficiency  Screening for colon cancer  Screening for prostate cancer  Primary hypertension Assessment & Plan: Vitals:   08/20/23 0933  BP: 121/80  Blood pressure controlled in today's visit Continue Losartan 25 mg daily and carvedilol 6.25 twice daily Patient is self pay and denied labs to be done today, he stated he is working on applying for insurance          Explained non pharmacological interventions such as low salt, DASH diet discussed. Educated on stress reduction and physical activity minimum 150 minutes per week. Discussed signs and symptoms of major cardiovascular event and need to present to the ED. Follow up in 3 months or sooner if needed. Patient verbalizes understanding regarding plan of care and all questions answered.    Other orders -     Tamsulosin HCl; Take 1 capsule (0.4 mg total) by mouth daily.  Dispense: 30 capsule; Refill:  3    Return in about 4 months (around 12/20/2023), or if symptoms worsen or fail to improve, for type 2 diabetes, hypertension, hyperlipidemia.   Cruzita Lederer Newman Nip, FNP

## 2023-09-13 NOTE — Progress Notes (Unsigned)
Cardiology Office Note:  .   Date:  09/14/2023 ID:  Seth [name], DOB [birth year], MRN [id number] PCP: [facility]  [facility] Providers Cardiologist:  Nanetta Batty, MD    History of Present Illness: .   Seth [name] is a 62 y.o. male with a PMH of CAD, s/p NSTEMI (06/2023), HFmrEF, HLD, T2DM, tobacco use, and BPH who presents today for 6-week follow-up.   Hospital admission 06/2023 for chest pain, was found to have elevated high sensitivity troponin with concerns for NSTEMI. Underwent cardiac cath on 7/7/and received successful PCI/DES x 1 to mLAD and DES x 1 to diagonal. Moderate nonobstructive residual disease along mLAD was recommended to be treated medically. Was also given IV Lasix in Cath Lab for elevated LV filling pressures. Recommended for DAPT with ASA/Brilinta x 1 year. TTE EF 45-50%, grade 1 DD.   I last saw him for TOC follow-up on 07/13/2023. Was doing better since leaving the hospital.  Currently did not have health insurance, and could afford Jardiance. Was taking Brilinta and was about to switch to Plavix when Brilinta ran out. Had been instructed on how to do this. Admitted to mild chest pain after heart cath, not as severe as when he presented to the ED. Noticed some fatigue and shortness of breath with exertion since hospital d/c. Denied any palpitations, syncope, presyncope, dizziness, orthopnea, PND, swelling or significant weight changes, acute bleeding, or claudication.  Today he presents for follow-up.  Doing better since last office visit.  Does notice that he he "sweats profusely when he gets hot," says overall he feels good but feels tired at time and attributes this to the heat.  Admits to musculoskeletal chest pain along his left side due to how he sleeps at night.  Has been staying active.  He has been working on changing his diet and has not had a cigarette in 3 months.  Wanting to change his PCP back to [facility] in [place]. Requesting to go back to work. Denies any anginal chest pain, shortness of breath, palpitations, syncope, presyncope, dizziness, orthopnea, PND, swelling or significant weight changes, acute bleeding, or claudication.  SH: Works at [employer]   Studies Reviewed: .    Echo 06/2023:  1. Left ventricular ejection fraction, by estimation, is 45 to 50%. The  left ventricle has mildly decreased function. The left ventricle  demonstrates regional wall motion abnormalities (see scoring  diagram/findings for description). There is mild  concentric left ventricular hypertrophy. Left ventricular diastolic  parameters are consistent with Grade I diastolic dysfunction (impaired  relaxation).   2. Right ventricular systolic function is normal. The right ventricular  size is normal. Tricuspid regurgitation signal is inadequate for assessing  PA pressure.   3. The mitral valve is grossly normal. Trivial mitral valve  regurgitation.   4. The aortic valve is tricuspid. Aortic valve regurgitation is not  visualized.   5. Unable to estimate CVP.   Comparison(s): No prior Echocardiogram.  LHC 06/2023:    Prox RCA to Mid RCA lesion is 20% stenosed.   Prox Cx lesion is 20% stenosed.   Prox LAD to Mid LAD lesion is 99% stenosed.   1st Diag lesion is 99% stenosed.   Mid LAD lesion is 50% stenosed.   A drug-eluting stent was successfully placed using a SYNERGY XD 3.0X12.   A drug-eluting stent was successfully placed using a SYNERGY XD 3.0X16.   Post intervention, there is a 0%  residual stenosis.   Post intervention, there is a 0% residual stenosis.   Acute anterolateral STEMI secondary to thrombotic sub-total occlusion of the mid LAD and large Diagonal branch.  Successful PTCA/DES x 1 mid LAD Successful PTCA/DES x 1 Diagonal Moderate non-obstructive residual disease in the mid LAD beyond the stented segment Mild non-obstructive disease in the moderate caliber  Circumflex and in the large dominant RCA Elevated LV filling pressure-40 mg IV Lasix given in the cath lab (LV 148/19/32) LV gram with hypokinesis of the anterior and apical walls.    Recommendations: Will transfer to the ICU. Echo today. Continue Aggrastat for 2 hours. DAPT with ASA and Brilinta for one year. High intensity statin. I anticipate that he will need to be on a beta blocker but given soft BP post cath will not start today. He was given IV Lasix in the cath lab. Will write for an additional 40 mg IV lasix tonight.   Physical Exam:   VS:  BP 116/77   Pulse 77   Ht 6\' 3"  (1.905 m)   Wt 223 lb (101.2 kg)   SpO2 93%   BMI 27.87 kg/m    Wt Readings from Last 3 Encounters:  09/14/23 223 lb (101.2 kg)  08/20/23 220 lb 1.9 oz (99.8 kg)  07/13/23 221 lb 3.2 oz (100.3 kg)    GEN: Well nourished, well developed in no acute distress NECK: No JVD; No carotid bruits CARDIAC: S1/S2, RRR, no murmurs, rubs, gallops RESPIRATORY:  Clear to auscultation without rales, wheezing or rhonchi  ABDOMEN: Soft, non-tender, non-distended EXTREMITIES:  No edema; No deformity   ASSESSMENT AND PLAN: .    CAD, s/p NSTEMI Admits to MSK chest pain, no anginal symptoms. Underwent cardiac cath and received successful PCI/DES x 1 to mLAD and DES x 1 to diagnonal.  Moderate nonobstructive residual disease in mid LAD to be treated medically.  Continue aspirin, Plavix, atorvastatin, losartan, carvedilol, and nitroglycerin as needed.  He is due for labs to be rechecked-he politely request to have his labs checked with the health department next month.  Will request they check CBC, CMET, and fasting lipid panel next month.  Heart healthy diet and regular cardiovascular exercise encouraged. Okay to start cardiac rehab. ED precautions discussed. Have given him work note. Recommended Tylenol PRN instead of Advil.    Cardiac Rehabilitation Eligibility Assessment  The patient is ready to start cardiac rehabilitation  from a cardiac standpoint.    2. HFmrEF Stage C, NYHA class I symptoms.  TTE 06/2023 revealed EF 45 to 50%. Euvolemic and well compensated on exam.  Continue carvedilol, Jardiance, and losartan.  Unable to further titrate GDMT due to patient's BP.  Low sodium diet, fluid restriction <2L, and daily weights encouraged. Educated to contact our office for weight gain of 2 lbs overnight or 5 lbs in one week.  Will update echocardiogram in 3 weeks.   3. HLD Tolerating Lipitor well.  Continue atorvastatin.  He is due for labs to be rechecked, however he politely request to have this checked with health department next month.  Have requested for CMET and fasting lipid panel to be checked in for results to be faxed to our office.  He verbalized understanding.  Heart healthy diet and regular cardiovascular exercise encouraged.   Dispo: Follow-up with me or APP in 3 months weeks or sooner if anything changes.   Signed, Sharlene Dory, NP

## 2023-09-14 ENCOUNTER — Encounter: Payer: Self-pay | Admitting: Nurse Practitioner

## 2023-09-14 ENCOUNTER — Ambulatory Visit: Payer: Medicaid Other | Attending: Nurse Practitioner | Admitting: Nurse Practitioner

## 2023-09-14 ENCOUNTER — Other Ambulatory Visit (HOSPITAL_COMMUNITY): Payer: Self-pay

## 2023-09-14 VITALS — BP 116/77 | HR 77 | Ht 75.0 in | Wt 223.0 lb

## 2023-09-14 DIAGNOSIS — I2510 Atherosclerotic heart disease of native coronary artery without angina pectoris: Secondary | ICD-10-CM

## 2023-09-14 DIAGNOSIS — I5022 Chronic systolic (congestive) heart failure: Secondary | ICD-10-CM

## 2023-09-14 DIAGNOSIS — E785 Hyperlipidemia, unspecified: Secondary | ICD-10-CM | POA: Diagnosis not present

## 2023-09-14 DIAGNOSIS — I252 Old myocardial infarction: Secondary | ICD-10-CM

## 2023-09-14 DIAGNOSIS — Z955 Presence of coronary angioplasty implant and graft: Secondary | ICD-10-CM

## 2023-09-14 DIAGNOSIS — I214 Non-ST elevation (NSTEMI) myocardial infarction: Secondary | ICD-10-CM

## 2023-09-14 NOTE — Patient Instructions (Addendum)
Medication Instructions:  Your physician recommends that you continue on your current medications as directed. Please refer to the Current Medication list given to you today.  Labwork: Please have LABS done at your primary care we recommend CBC, CMET, FLP please have these results faxed to our office at [phone]   Testing/Procedures: Your physician has requested that you have an echocardiogram. Echocardiography is a painless test that uses sound waves to create images of your heart. It provides your doctor with information about the size and shape of your heart and how well your heart's chambers and valves are working. This procedure takes approximately one hour. There are no restrictions for this procedure. Please do NOT wear cologne, perfume, aftershave, or lotions (deodorant is allowed). Please arrive 15 minutes prior to your appointment time.  Follow-Up: Your physician recommends that you schedule a follow-up appointment in: 3 Months   Any Other Special Instructions Will Be Listed Below (If Applicable).  If you need a refill on your cardiac medications before your next appointment, please call your pharmacy.

## 2023-09-16 ENCOUNTER — Encounter (HOSPITAL_COMMUNITY)
Admission: RE | Admit: 2023-09-16 | Discharge: 2023-09-16 | Disposition: A | Discharge status: Home / Self Care | Payer: Medicaid Other | Source: Ambulatory Visit | Attending: Cardiology | Admitting: Cardiology

## 2023-09-16 DIAGNOSIS — Z955 Presence of coronary angioplasty implant and graft: Secondary | ICD-10-CM | POA: Insufficient documentation

## 2023-09-16 DIAGNOSIS — I214 Non-ST elevation (NSTEMI) myocardial infarction: Secondary | ICD-10-CM | POA: Insufficient documentation

## 2023-09-21 ENCOUNTER — Encounter (HOSPITAL_COMMUNITY)
Admission: RE | Admit: 2023-09-21 | Discharge: 2023-09-21 | Disposition: A | Discharge status: Home / Self Care | Payer: Medicaid Other | Source: Ambulatory Visit | Attending: Cardiology

## 2023-09-21 VITALS — Ht 74.0 in | Wt 227.1 lb

## 2023-09-21 DIAGNOSIS — I214 Non-ST elevation (NSTEMI) myocardial infarction: Secondary | ICD-10-CM

## 2023-09-21 DIAGNOSIS — Z955 Presence of coronary angioplasty implant and graft: Secondary | ICD-10-CM

## 2023-09-21 NOTE — Progress Notes (Signed)
Cardiac Individual Treatment Plan  Patient Details  Name: Seth [name] MRN: [id number] Date of Birth: 05/21/1961 Referring Provider:   Flowsheet Row CARDIAC REHAB PHASE II ORIENTATION from 09/21/2023 in [facility] CARDIAC REHABILITATION  Referring Provider Nanetta Batty MD       Initial Encounter Date:  Flowsheet Row CARDIAC REHAB PHASE II ORIENTATION from 09/21/2023 in [place] CARDIAC REHABILITATION  Date 09/21/23       Visit Diagnosis: NSTEMI (non-ST elevated myocardial infarction) [facility])  Status post coronary artery stent placement  Patient's Home Medications on Admission:  Current Outpatient Medications:    aspirin EC 81 MG tablet, Take 1 tablet (81 mg total) by mouth daily. Swallow whole., Disp: 30 tablet, Rfl: 2   atorvastatin (LIPITOR) 80 MG tablet, Take 1 tablet (80 mg total) by mouth daily., Disp: 90 tablet, Rfl: 1   carvedilol (COREG) 6.25 MG tablet, Take 1 tablet (6.25 mg total) by mouth 2 (two) times daily with a meal. (Patient not taking: Reported on 09/16/2023), Disp: 180 tablet, Rfl: 1   clopidogrel (PLAVIX) 75 MG tablet, Take 1 tablet (75 mg total) by mouth daily., Disp: 90 tablet, Rfl: 2   empagliflozin (JARDIANCE) 10 MG TABS tablet, Take 1 tablet (10 mg total) by mouth daily., Disp: 30 tablet, Rfl: 3   ibuprofen (ADVIL) 200 MG tablet, Take 200 mg by mouth every 6 (six) hours as needed for headache or mild pain., Disp: , Rfl:    losartan (COZAAR) 25 MG tablet, Take 1 tablet (25 mg total) by mouth daily., Disp: 90 tablet, Rfl: 1   Multiple Vitamin (MULTIVITAMIN WITH MINERALS) TABS tablet, Take 1 tablet by mouth daily., Disp: , Rfl:    nitroGLYCERIN (NITROSTAT) 0.4 MG SL tablet, Place 1 tablet (0.4 mg total) under the tongue every 5 (five) minutes x 3 doses as needed for chest pain., Disp: 25 tablet, Rfl: 2   Nutritional Supplements (PROSTATE PO), Take 1 tablet by mouth daily. (Patient not taking: Reported on 09/16/2023), Disp: , Rfl:    tamsulosin (FLOMAX) 0.4  MG CAPS capsule, Take 0.4 mg by mouth daily., Disp: , Rfl:   Past Medical History: Past Medical History:  Diagnosis Date   BPH (benign prostatic hyperplasia)    Hyperlipidemia    Hypertension    NSTEMI (non-ST elevated myocardial infarction) (HCC)    PONV (postoperative nausea and vomiting)    Type 2 diabetes mellitus (HCC)     Tobacco Use: Social History   Tobacco Use  Smoking Status Former   Current packs/day: 1.00   Average packs/day: 1 pack/day for 43.7 years (43.7 ttl pk-yrs)   Types: Cigarettes   Start date: 63  Smokeless Tobacco Former  Tobacco Comments   Quit date 06/27/2023    Labs: Review Flowsheet  More data exists      Latest Ref Rng & Units 02/13/2010 03/08/2010 06/27/2023 06/28/2023 06/29/2023  Labs for ITP Cardiac and Pulmonary Rehab  Cholestrol 0 - 200 mg/dL 409        ATP III CLASSIFICATION:  <200     mg/dL   Desirable  811-914  mg/dL   Borderline High  >=782    mg/dL   High         - - 956  98   LDL (calc) 0 - 99 mg/dL 213        Total Cholesterol/HDL:CHD Risk Coronary Heart Disease Risk Table  Men   Women  1/2 Average Risk   3.4   3.3  Average Risk       5.0   4.4  2 X Average Risk   9.6   7.1  3 X Average Risk  23.4   11.0        Use the calculated Patient Ratio above and the CHD Risk Table to determine the patient's CHD Risk.        ATP III CLASSIFICATION (LDL):  <100     mg/dL   Optimal  952-841  mg/dL   Near or Above                    Optimal  130-159  mg/dL   Borderline  324-401  mg/dL   High  >027     mg/dL   Very High  - - 72  65   HDL-C >40 mg/dL 35  - - 33  14   Trlycerides <150 mg/dL 253  - - 664  95   Hemoglobin A1c 4.8 - 5.6 % 5.9 (NOTE) The ADA recommends the following therapeutic goal for glycemic control related to Hgb A1c measurement: Goal of therapy: <6.5 Hgb A1c  Reference: American Diabetes Association: Clinical Practice Recommendations 2010, Diabetes Care, 2010, 33: (Suppl  1).  5.9  6.8  - -     Details            Capillary Blood Glucose: Lab Results  Component Value Date   GLUCAP 165 (H) 06/30/2023   GLUCAP 139 (H) 06/29/2023   GLUCAP 105 (H) 06/29/2023   GLUCAP 140 (H) 06/29/2023   GLUCAP 155 (H) 06/27/2023     Exercise Target Goals: Exercise Program Goal: Individual exercise prescription set using results from initial 6 min walk test and THRR while considering  patient's activity barriers and safety.   Exercise Prescription Goal: Starting with aerobic activity 30 plus minutes a day, 3 days per week for initial exercise prescription. Provide home exercise prescription and guidelines that participant acknowledges understanding prior to discharge.  Activity Barriers & Risk Stratification:  Activity Barriers & Cardiac Risk Stratification - 09/21/23 1348       Activity Barriers & Cardiac Risk Stratification   Activity Barriers Shortness of Breath;Arthritis;Joint Problems;Balance Concerns   chronic bad knees, hips, shoulders from construction work   Cardiac Risk Stratification Moderate             6 Minute Walk:  6 Minute Walk     Row Name 09/21/23 1346         6 Minute Walk   Phase Initial     Distance 1637 feet     Walk Time 6 minutes     # of Rest Breaks 0     MPH 3.1     METS 3.97     RPE 9     VO2 Peak 13.89     Symptoms No     Resting HR 65 bpm     Resting BP 108/66     Resting Oxygen Saturation  94 %     Exercise Oxygen Saturation  during 6 min walk 95 %     Max Ex. HR 102 bpm     Max Ex. BP 128/74     2 Minute Post BP 112/66              Oxygen Initial Assessment:   Oxygen Re-Evaluation:   Oxygen Discharge (Final Oxygen Re-Evaluation):   Initial Exercise Prescription:  Initial Exercise Prescription - 09/21/23 1300       Date of Initial Exercise RX and Referring Provider   Date 09/21/23    Referring Provider Nanetta Batty MD      Oxygen   Maintain Oxygen Saturation 88% or higher      Treadmill   MPH 3.1     Grade 1    Minutes 15    METs 3.8      REL-XR   Level 3    Speed 50    Minutes 15    METs 3.5      Prescription Details   Frequency (times per week) 3    Duration Progress to 30 minutes of continuous aerobic without signs/symptoms of physical distress      Intensity   THRR 40-80% of Max Heartrate 103-140    Ratings of Perceived Exertion 11-13    Perceived Dyspnea 0-4      Progression   Progression Continue to progress workloads to maintain intensity without signs/symptoms of physical distress.      Resistance Training   Training Prescription Yes    Weight 5 lb    Reps 10-15             Perform Capillary Blood Glucose checks as needed.  Exercise Prescription Changes:   Exercise Prescription Changes     Row Name 09/21/23 1300             Response to Exercise   Blood Pressure (Admit) 108/66       Blood Pressure (Exercise) 128/74       Blood Pressure (Exit) 112/66       Heart Rate (Admit) 65 bpm       Heart Rate (Exercise) 102 bpm       Heart Rate (Exit) 67 bpm       Oxygen Saturation (Admit) 94 %       Oxygen Saturation (Exercise) 95 %       Rating of Perceived Exertion (Exercise) 9       Symptoms none       Comments walk test results                Exercise Comments:   Exercise Goals and Review:   Exercise Goals     Row Name 09/21/23 1357             Exercise Goals   Increase Physical Activity Yes       Intervention Develop an individualized exercise prescription for aerobic and resistive training based on initial evaluation findings, risk stratification, comorbidities and participant's personal goals.;Provide advice, education, support and counseling about physical activity/exercise needs.       Expected Outcomes Short Term: Attend rehab on a regular basis to increase amount of physical activity.;Long Term: Add in home exercise to make exercise part of routine and to increase amount of physical activity.;Long Term: Exercising regularly at  least 3-5 days a week.       Increase Strength and Stamina Yes       Intervention Provide advice, education, support and counseling about physical activity/exercise needs.;Develop an individualized exercise prescription for aerobic and resistive training based on initial evaluation findings, risk stratification, comorbidities and participant's personal goals.       Expected Outcomes Short Term: Increase workloads from initial exercise prescription for resistance, speed, and METs.;Long Term: Improve cardiorespiratory fitness, muscular endurance and strength as measured by increased METs and functional capacity ( );Short Term: Perform resistance training exercises routinely during rehab and  add in resistance training at home       Able to understand and use rate of perceived exertion (RPE) scale Yes       Intervention Provide education and explanation on how to use RPE scale       Expected Outcomes Short Term: Able to use RPE daily in rehab to express subjective intensity level;Long Term:  Able to use RPE to guide intensity level when exercising independently       Able to understand and use Dyspnea scale Yes       Intervention Provide education and explanation on how to use Dyspnea scale       Expected Outcomes Short Term: Able to use Dyspnea scale daily in rehab to express subjective sense of shortness of breath during exertion;Long Term: Able to use Dyspnea scale to guide intensity level when exercising independently       Knowledge and understanding of Target Heart Rate Range (THRR) Yes       Intervention Provide education and explanation of THRR including how the numbers were predicted and where they are located for reference       Expected Outcomes Short Term: Able to state/look up THRR;Long Term: Able to use THRR to govern intensity when exercising independently;Short Term: Able to use daily as guideline for intensity in rehab       Able to check pulse independently Yes       Intervention  Review the importance of being able to check your own pulse for safety during independent exercise;Provide education and demonstration on how to check pulse in carotid and radial arteries.       Expected Outcomes Short Term: Able to explain why pulse checking is important during independent exercise;Long Term: Able to check pulse independently and accurately       Understanding of Exercise Prescription Yes       Intervention Provide education, explanation, and written materials on patient's individual exercise prescription       Expected Outcomes Short Term: Able to explain program exercise prescription;Long Term: Able to explain home exercise prescription to exercise independently                Exercise Goals Re-Evaluation :    Discharge Exercise Prescription (Final Exercise Prescription Changes):  Exercise Prescription Changes - 09/21/23 1300       Response to Exercise   Blood Pressure (Admit) 108/66    Blood Pressure (Exercise) 128/74    Blood Pressure (Exit) 112/66    Heart Rate (Admit) 65 bpm    Heart Rate (Exercise) 102 bpm    Heart Rate (Exit) 67 bpm    Oxygen Saturation (Admit) 94 %    Oxygen Saturation (Exercise) 95 %    Rating of Perceived Exertion (Exercise) 9    Symptoms none    Comments walk test results             Nutrition:  Target Goals: Understanding of nutrition guidelines, daily intake of sodium 1500mg , cholesterol 200mg , calories 30% from fat and 7% or less from saturated fats, daily to have 5 or more servings of fruits and vegetables.  Biometrics:  Pre Biometrics - 09/21/23 1358       Pre Biometrics   Height 6\' 2"  (1.88 m)    Weight 103 kg    Waist Circumference 40 inches    Hip Circumference 41.5 inches    Waist to Hip Ratio 0.96 %    BMI (Calculated) 29.15    Grip Strength 37.8 kg  Single Leg Stand 4.2 seconds              Nutrition Therapy Plan and Nutrition Goals:  Nutrition Therapy & Goals - 09/21/23 1359        Intervention Plan   Intervention Prescribe, educate and counsel regarding individualized specific dietary modifications aiming towards targeted core components such as weight, hypertension, lipid management, diabetes, heart failure and other comorbidities.    Expected Outcomes Short Term Goal: Understand basic principles of dietary content, such as calories, fat, sodium, cholesterol and nutrients.;Long Term Goal: Adherence to prescribed nutrition plan.             Nutrition Assessments:  MEDIFICTS Score Key: >=70 Need to make dietary changes  40-70 Heart Healthy Diet <= 40 Therapeutic Level Cholesterol Diet   Picture Your Plate Scores: <16 Unhealthy dietary pattern with much room for improvement. 41-50 Dietary pattern unlikely to meet recommendations for good health and room for improvement. 51-60 More healthful dietary pattern, with some room for improvement.  >60 Healthy dietary pattern, although there may be some specific behaviors that could be improved.    Nutrition Goals Re-Evaluation:   Nutrition Goals Discharge (Final Nutrition Goals Re-Evaluation):   Psychosocial: Target Goals: Acknowledge presence or absence of significant depression and/or stress, maximize coping skills, provide positive support system. Participant is able to verbalize types and ability to use techniques and skills needed for reducing stress and depression.  Initial Review & Psychosocial Screening:  Initial Psych Review & Screening - 09/16/23 1040       Initial Review   Current issues with None Identified      Family Dynamics   Good Support System? Yes      Barriers   Psychosocial barriers to participate in program There are no identifiable barriers or psychosocial needs.;The patient should benefit from training in stress management and relaxation.      Screening Interventions   Interventions To provide support and resources with identified psychosocial needs;Provide feedback about the  scores to participant;Encouraged to exercise    Expected Outcomes Short Term goal: Utilizing psychosocial counselor, staff and physician to assist with identification of specific Stressors or current issues interfering with healing process. Setting desired goal for each stressor or current issue identified.;Long Term Goal: Stressors or current issues are controlled or eliminated.;Short Term goal: Identification and review with participant of any Quality of Life or Depression concerns found by scoring the questionnaire.;Long Term goal: The participant improves quality of Life and PHQ9 Scores as seen by post scores and/or verbalization of changes             Quality of Life Scores:  Quality of Life - 09/21/23 1359       Quality of Life   Select Quality of Life      Quality of Life Scores   Health/Function Pre 24.46 %    Socioeconomic Pre 26.81 %    Psych/Spiritual Pre 24.29 %    Family Pre 26 %    GLOBAL Pre 25.25 %            Scores of 19 and below usually indicate a poorer quality of life in these areas.  A difference of  2-3 points is a clinically meaningful difference.  A difference of 2-3 points in the total score of the Quality of Life Index has been associated with significant improvement in overall quality of life, self-image, physical symptoms, and general health in studies assessing change in quality of life.  PHQ-9: Review Flowsheet  09/21/2023 08/20/2023  Depression screen PHQ 2/9  Decreased Interest 0 0  Down, Depressed, Hopeless 0 0  PHQ - 2 Score 0 0  Altered sleeping 0 0  Tired, decreased energy 0 0  Change in appetite 0 0  Feeling bad or failure about yourself  0 0  Trouble concentrating 0 0  Moving slowly or fidgety/restless 0 0  Suicidal thoughts 0 0  PHQ-9 Score 0 0  Difficult doing work/chores Not difficult at all Not difficult at all    Details           Interpretation of Total Score  Total Score Depression Severity:  1-4 = Minimal  depression, 5-9 = Mild depression, 10-14 = Moderate depression, 15-19 = Moderately severe depression, 20-27 = Severe depression   Psychosocial Evaluation and Intervention:  Psychosocial Evaluation - 09/16/23 1042       Psychosocial Evaluation & Interventions   Interventions Stress management education;Encouraged to exercise with the program and follow exercise prescription    Comments Patient was referred to Cr with NSTEMI and stent placement 06/27/23. His CR was delayed due to not having health insurance. He denies any depression or anxiety. He has been out of work since his event and is ready to go back. He has been released to start back Tuesday 10/1. He works at a [employer] in [place]. He says he is very active on his job. He denies any stressors in his life. He says his mother passed away in 06-22-23. He lives alone but has good support from his sister and some close friends. His main goals for the program are to learn what he is able to do; make sure his heart is working okay and to get back to his normal life. He says he sleeps well at night. 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 He is not sure how much of the program he wants to do due to having to miss work but has committed to at least 21 sessions and will "see how it goes".  His work schedule could be a possible barriers for him to complete 36 sessions.    Expected Outcomes Short Term: start the program and attend consistenly. Long Term: Meet his personal goals.    Continue Psychosocial Services  Follow up required by staff             Psychosocial Re-Evaluation:   Psychosocial Discharge (Final Psychosocial Re-Evaluation):   Vocational Rehabilitation: Provide vocational rehab assistance to qualifying candidates.   Vocational Rehab Evaluation & Intervention:  Vocational Rehab - 09/16/23 1041       Initial Vocational Rehab Evaluation & Intervention    Assessment shows need for Vocational Rehabilitation No      Vocational Rehab Re-Evaulation   Comments Patient is returning to work next week at a [employer].             Education: Education Goals: Education classes will be provided on a weekly basis, covering required topics. Participant will state understanding/return demonstration of topics presented.  Learning Barriers/Preferences:  Learning Barriers/Preferences - 09/16/23 1040       Learning Barriers/Preferences   Learning Barriers None    Learning Preferences Skilled Demonstration             Education Topics: Hypertension, Hypertension Reduction -Define heart disease and high blood pressure. Discus how high blood pressure affects the body and ways to reduce high blood pressure.   Exercise and Your  Heart -Discuss why it is important to exercise, the FITT principles of exercise, normal and abnormal responses to exercise, and how to exercise safely.   Angina -Discuss definition of angina, causes of angina, treatment of angina, and how to decrease risk of having angina.   Cardiac Medications -Review what the following cardiac medications are used for, how they affect the body, and side effects that may occur when taking the medications.  Medications include Aspirin, Beta blockers, calcium channel blockers, ACE Inhibitors, angiotensin receptor blockers, diuretics, digoxin, and antihyperlipidemics.   Congestive Heart Failure -Discuss the definition of CHF, how to live with CHF, the signs and symptoms of CHF, and how keep track of weight and sodium intake.   Heart Disease and Intimacy -Discus the effect sexual activity has on the heart, how changes occur during intimacy as we age, and safety during sexual activity.   Smoking Cessation / COPD -Discuss different methods to quit smoking, the health benefits of quitting smoking, and the definition of COPD.   Nutrition I: Fats -Discuss the types of  cholesterol, what cholesterol does to the heart, and how cholesterol levels can be controlled.   Nutrition II: Labels -Discuss the different components of food labels and how to read food label   Heart Parts/Heart Disease and PAD -Discuss the anatomy of the heart, the pathway of blood circulation through the heart, and these are affected by heart disease.   Stress I: Signs and Symptoms -Discuss the causes of stress, how stress may lead to anxiety and depression, and ways to limit stress.   Stress II: Relaxation -Discuss different types of relaxation techniques to limit stress.   Warning Signs of Stroke / TIA -Discuss definition of a stroke, what the signs and symptoms are of a stroke, and how to identify when someone is having stroke.   Knowledge Questionnaire Score:  Knowledge Questionnaire Score - 09/21/23 1359       Knowledge Questionnaire Score   Pre Score 20/24             Core Components/Risk Factors/Patient Goals at Admission:  Personal Goals and Risk Factors at Admission - 09/21/23 1359       Core Components/Risk Factors/Patient Goals on Admission    Weight Management Weight Maintenance;Yes;Weight Loss    Intervention Weight Management: Develop a combined nutrition and exercise program designed to reach desired caloric intake, while maintaining appropriate intake of nutrient and fiber, sodium and fats, and appropriate energy expenditure required for the weight goal.;Weight Management: Provide education and appropriate resources to help participant work on and attain dietary goals.    Admit Weight 227 lb 1.6 oz (103 kg)    Goal Weight: Short Term 220 lb (99.8 kg)    Goal Weight: Long Term 215 lb (97.5 kg)    Expected Outcomes Short Term: Continue to assess and modify interventions until short term weight is achieved;Long Term: Adherence to nutrition and physical activity/exercise program aimed toward attainment of established weight goal;Weight Loss: Understanding  of general recommendations for a balanced deficit meal plan, which promotes 1-2 lb weight loss per week and includes a negative energy balance of [phone] kcal/d;Understanding recommendations for meals to include 15-35% energy as protein, 25-35% energy from fat, 35-60% energy from carbohydrates, less than 200mg  of dietary cholesterol, 20-35 gm of total fiber daily;Understanding of distribution of calorie intake throughout the day with the consumption of 4-5 meals/snacks    Tobacco Cessation Yes   quit in July   Intervention Assist the participant in steps to  quit. Provide individualized education and counseling about committing to Tobacco Cessation, relapse prevention, and pharmacological support that can be provided by physician.    Expected Outcomes Long Term: Complete abstinence from all tobacco products for at least 12 months from quit date.    Improve shortness of breath with ADL's Yes    Intervention Provide education, individualized exercise plan and daily activity instruction to help decrease symptoms of SOB with activities of daily living.    Expected Outcomes Short Term: Improve cardiorespiratory fitness to achieve a reduction of symptoms when performing ADLs;Long Term: Be able to perform more ADLs without symptoms or delay the onset of symptoms    Diabetes Yes    Intervention Provide education about signs/symptoms and action to take for hypo/hyperglycemia.;Provide education about proper nutrition, including hydration, and aerobic/resistive exercise prescription along with prescribed medications to achieve blood glucose in normal ranges: Fasting glucose 65-99 mg/dL    Expected Outcomes Short Term: Participant verbalizes understanding of the signs/symptoms and immediate care of hyper/hypoglycemia, proper foot care and importance of medication, aerobic/resistive exercise and nutrition plan for blood glucose control.;Long Term: Attainment of HbA1C < 7%.    Heart Failure Yes    Intervention Provide  a combined exercise and nutrition program that is supplemented with education, support and counseling about heart failure. Directed toward relieving symptoms such as shortness of breath, decreased exercise tolerance, and extremity edema.    Expected Outcomes Improve functional capacity of life;Short term: Attendance in program 2-3 days a week with increased exercise capacity. Reported lower sodium intake. Reported increased fruit and vegetable intake. Reports medication compliance.;Short term: Daily weights obtained and reported for increase. Utilizing diuretic protocols set by physician.;Long term: Adoption of self-care skills and reduction of barriers for early signs and symptoms recognition and intervention leading to self-care maintenance.    Hypertension Yes    Intervention Provide education on lifestyle modifcations including regular physical activity/exercise, weight management, moderate sodium restriction and increased consumption of fresh fruit, vegetables, and low fat dairy, alcohol moderation, and smoking cessation.;Monitor prescription use compliance.    Expected Outcomes Short Term: Continued assessment and intervention until BP is < 140/35mm HG in hypertensive participants. < 130/71mm HG in hypertensive participants with diabetes, heart failure or chronic kidney disease.;Long Term: Maintenance of blood pressure at goal levels.    Lipids Yes    Intervention Provide education and support for participant on nutrition & aerobic/resistive exercise along with prescribed medications to achieve LDL 70mg , HDL >40mg .    Expected Outcomes Short Term: Participant states understanding of desired cholesterol values and is compliant with medications prescribed. Participant is following exercise prescription and nutrition guidelines.;Long Term: Cholesterol controlled with medications as prescribed, with individualized exercise RX and with personalized nutrition plan. Value goals: LDL < 70mg , HDL > 40 mg.              Core Components/Risk Factors/Patient Goals Review:   Goals and Risk Factor Review     Row Name 09/21/23 1403             Core Components/Risk Factors/Patient Goals Review   Personal Goals Review Tobacco Cessation       Review Seth [name] has recently quit tobacco use within the last 6 months. Intervention for relapse prevention was provided at the initial medical review. He was encouraged to continue to with tobacco cessation and has had no cravings.  Patient was encouraged to ask staff for assistance if cravings return.  Staff will continue to provide encouragement and follow up with the  patient throughout the program.       Expected Outcomes Continued cessation                Core Components/Risk Factors/Patient Goals at Discharge (Final Review):   Goals and Risk Factor Review - 09/21/23 1403       Core Components/Risk Factors/Patient Goals Review   Personal Goals Review Tobacco Cessation    Review Seth [name] has recently quit tobacco use within the last 6 months. Intervention for relapse prevention was provided at the initial medical review. He was encouraged to continue to with tobacco cessation and has had no cravings.  Patient was encouraged to ask staff for assistance if cravings return.  Staff will continue to provide encouragement and follow up with the patient throughout the program.    Expected Outcomes Continued cessation             ITP Comments:  ITP Comments     Row Name 09/21/23 1339           ITP Comments Patient attend orientation today.  Patient is attendingCardiac Rehabilitation Program.  Documentation for diagnosis can be found in [facility] encouner 06/27/23.  Reviewed medical chart, RPE/RPD, gym safety, and program guidelines.  Patient was fitted to equipment they will be using during rehab.  Patient is scheduled to start exercise on Wed 09/23/23.   Initial ITP created and sent for review and signature by Dr. Dina Rich, Medical Director for Cardiac  Rehabilitation Program.                Comments: Initial ITP

## 2023-09-21 NOTE — Patient Instructions (Signed)
Patient Instructions  Patient Details  Name: Seth [name] MRN: [id number] Date of Birth: 04-29-1961 Referring Provider:  Antoine Poche, MD  Below are your personal goals for exercise, nutrition, and risk factors. Our goal is to help you stay on track towards obtaining and maintaining these goals. We will be discussing your progress on these goals with you throughout the program.  Initial Exercise Prescription:  Initial Exercise Prescription - 09/21/23 1300       Date of Initial Exercise RX and Referring Provider   Date 09/21/23    Referring Provider Nanetta Batty MD      Oxygen   Maintain Oxygen Saturation 88% or higher      Treadmill   MPH 3.1    Grade 1    Minutes 15    METs 3.8      REL-XR   Level 3    Speed 50    Minutes 15    METs 3.5      Prescription Details   Frequency (times per week) 3    Duration Progress to 30 minutes of continuous aerobic without signs/symptoms of physical distress      Intensity   THRR 40-80% of Max Heartrate 103-140    Ratings of Perceived Exertion 11-13    Perceived Dyspnea 0-4      Progression   Progression Continue to progress workloads to maintain intensity without signs/symptoms of physical distress.      Resistance Training   Training Prescription Yes    Weight 5 lb    Reps 10-15             Exercise Goals: Frequency: Be able to perform aerobic exercise two to three times per week in program working toward 2-5 days per week of home exercise.  Intensity: Work with a perceived exertion of 11 (fairly light) - 15 (hard) while following your exercise prescription.  We will make changes to your prescription with you as you progress through the program.   Duration: Be able to do 30 to 45 minutes of continuous aerobic exercise in addition to a 5 minute warm-up and a 5 minute cool-down routine.   Nutrition Goals: Your personal nutrition goals will be established when you do your nutrition analysis with the  dietician.  The following are general nutrition guidelines to follow: Cholesterol < 200mg /day Sodium < 1500mg /day Fiber: Men over 50 yrs - 30 grams per day  Personal Goals:  Personal Goals and Risk Factors at Admission - 09/21/23 1359       Core Components/Risk Factors/Patient Goals on Admission    Weight Management Weight Maintenance;Yes;Weight Loss    Intervention Weight Management: Develop a combined nutrition and exercise program designed to reach desired caloric intake, while maintaining appropriate intake of nutrient and fiber, sodium and fats, and appropriate energy expenditure required for the weight goal.;Weight Management: Provide education and appropriate resources to help participant work on and attain dietary goals.    Admit Weight 227 lb 1.6 oz (103 kg)    Goal Weight: Short Term 220 lb (99.8 kg)    Goal Weight: Long Term 215 lb (97.5 kg)    Expected Outcomes Short Term: Continue to assess and modify interventions until short term weight is achieved;Long Term: Adherence to nutrition and physical activity/exercise program aimed toward attainment of established weight goal;Weight Loss: Understanding of general recommendations for a balanced deficit meal plan, which promotes 1-2 lb weight loss per week and includes a negative energy balance of [phone] kcal/d;Understanding recommendations for meals  to include 15-35% energy as protein, 25-35% energy from fat, 35-60% energy from carbohydrates, less than 200mg  of dietary cholesterol, 20-35 gm of total fiber daily;Understanding of distribution of calorie intake throughout the day with the consumption of 4-5 meals/snacks    Tobacco Cessation Yes   quit in July   Intervention Assist the participant in steps to quit. Provide individualized education and counseling about committing to Tobacco Cessation, relapse prevention, and pharmacological support that can be provided by physician.    Expected Outcomes Long Term: Complete abstinence from  all tobacco products for at least 12 months from quit date.    Improve shortness of breath with ADL's Yes    Intervention Provide education, individualized exercise plan and daily activity instruction to help decrease symptoms of SOB with activities of daily living.    Expected Outcomes Short Term: Improve cardiorespiratory fitness to achieve a reduction of symptoms when performing ADLs;Long Term: Be able to perform more ADLs without symptoms or delay the onset of symptoms    Diabetes Yes    Intervention Provide education about signs/symptoms and action to take for hypo/hyperglycemia.;Provide education about proper nutrition, including hydration, and aerobic/resistive exercise prescription along with prescribed medications to achieve blood glucose in normal ranges: Fasting glucose 65-99 mg/dL    Expected Outcomes Short Term: Participant verbalizes understanding of the signs/symptoms and immediate care of hyper/hypoglycemia, proper foot care and importance of medication, aerobic/resistive exercise and nutrition plan for blood glucose control.;Long Term: Attainment of HbA1C < 7%.    Heart Failure Yes    Intervention Provide a combined exercise and nutrition program that is supplemented with education, support and counseling about heart failure. Directed toward relieving symptoms such as shortness of breath, decreased exercise tolerance, and extremity edema.    Expected Outcomes Improve functional capacity of life;Short term: Attendance in program 2-3 days a week with increased exercise capacity. Reported lower sodium intake. Reported increased fruit and vegetable intake. Reports medication compliance.;Short term: Daily weights obtained and reported for increase. Utilizing diuretic protocols set by physician.;Long term: Adoption of self-care skills and reduction of barriers for early signs and symptoms recognition and intervention leading to self-care maintenance.    Hypertension Yes    Intervention Provide  education on lifestyle modifcations including regular physical activity/exercise, weight management, moderate sodium restriction and increased consumption of fresh fruit, vegetables, and low fat dairy, alcohol moderation, and smoking cessation.;Monitor prescription use compliance.    Expected Outcomes Short Term: Continued assessment and intervention until BP is < 140/72mm HG in hypertensive participants. < 130/69mm HG in hypertensive participants with diabetes, heart failure or chronic kidney disease.;Long Term: Maintenance of blood pressure at goal levels.    Lipids Yes    Intervention Provide education and support for participant on nutrition & aerobic/resistive exercise along with prescribed medications to achieve LDL 70mg , HDL >40mg .    Expected Outcomes Short Term: Participant states understanding of desired cholesterol values and is compliant with medications prescribed. Participant is following exercise prescription and nutrition guidelines.;Long Term: Cholesterol controlled with medications as prescribed, with individualized exercise RX and with personalized nutrition plan. Value goals: LDL < 70mg , HDL > 40 mg.             Tobacco Use Initial Evaluation: Social History   Tobacco Use  Smoking Status Former   Current packs/day: 1.00   Average packs/day: 1 pack/day for 43.7 years (43.7 ttl pk-yrs)   Types: Cigarettes   Start date: 47  Smokeless Tobacco Former  Tobacco Comments   Quit date  06/27/2023    Exercise Goals and Review:  Exercise Goals     Row Name 09/21/23 1357             Exercise Goals   Increase Physical Activity Yes       Intervention Develop an individualized exercise prescription for aerobic and resistive training based on initial evaluation findings, risk stratification, comorbidities and participant's personal goals.;Provide advice, education, support and counseling about physical activity/exercise needs.       Expected Outcomes Short Term: Attend rehab  on a regular basis to increase amount of physical activity.;Long Term: Add in home exercise to make exercise part of routine and to increase amount of physical activity.;Long Term: Exercising regularly at least 3-5 days a week.       Increase Strength and Stamina Yes       Intervention Provide advice, education, support and counseling about physical activity/exercise needs.;Develop an individualized exercise prescription for aerobic and resistive training based on initial evaluation findings, risk stratification, comorbidities and participant's personal goals.       Expected Outcomes Short Term: Increase workloads from initial exercise prescription for resistance, speed, and METs.;Long Term: Improve cardiorespiratory fitness, muscular endurance and strength as measured by increased METs and functional capacity ( );Short Term: Perform resistance training exercises routinely during rehab and add in resistance training at home       Able to understand and use rate of perceived exertion (RPE) scale Yes       Intervention Provide education and explanation on how to use RPE scale       Expected Outcomes Short Term: Able to use RPE daily in rehab to express subjective intensity level;Long Term:  Able to use RPE to guide intensity level when exercising independently       Able to understand and use Dyspnea scale Yes       Intervention Provide education and explanation on how to use Dyspnea scale       Expected Outcomes Short Term: Able to use Dyspnea scale daily in rehab to express subjective sense of shortness of breath during exertion;Long Term: Able to use Dyspnea scale to guide intensity level when exercising independently       Knowledge and understanding of Target Heart Rate Range (THRR) Yes       Intervention Provide education and explanation of THRR including how the numbers were predicted and where they are located for reference       Expected Outcomes Short Term: Able to state/look up THRR;Long  Term: Able to use THRR to govern intensity when exercising independently;Short Term: Able to use daily as guideline for intensity in rehab       Able to check pulse independently Yes       Intervention Review the importance of being able to check your own pulse for safety during independent exercise;Provide education and demonstration on how to check pulse in carotid and radial arteries.       Expected Outcomes Short Term: Able to explain why pulse checking is important during independent exercise;Long Term: Able to check pulse independently and accurately       Understanding of Exercise Prescription Yes       Intervention Provide education, explanation, and written materials on patient's individual exercise prescription       Expected Outcomes Short Term: Able to explain program exercise prescription;Long Term: Able to explain home exercise prescription to exercise independently              Copy of goals given  to participant.

## 2023-09-23 ENCOUNTER — Encounter (HOSPITAL_COMMUNITY)
Admission: RE | Admit: 2023-09-23 | Discharge: 2023-09-23 | Disposition: A | Discharge status: Home / Self Care | Payer: Medicaid Other | Source: Ambulatory Visit | Attending: Cardiovascular Disease | Admitting: Cardiovascular Disease

## 2023-09-23 DIAGNOSIS — Z955 Presence of coronary angioplasty implant and graft: Secondary | ICD-10-CM | POA: Insufficient documentation

## 2023-09-23 DIAGNOSIS — I214 Non-ST elevation (NSTEMI) myocardial infarction: Secondary | ICD-10-CM | POA: Insufficient documentation

## 2023-09-23 LAB — GLUCOSE, CAPILLARY
Glucose-Capillary: 148 mg/dL — ABNORMAL HIGH (ref 70–99)
Glucose-Capillary: 97 mg/dL (ref 70–99)

## 2023-09-23 NOTE — Progress Notes (Signed)
Daily Session Note  Patient Details  Name: Seth [name] MRN: [id number] Date of Birth: 1961-05-26 Referring Provider:   Flowsheet Row CARDIAC REHAB PHASE II ORIENTATION from 09/21/2023 in [facility] CARDIAC REHABILITATION  Referring Provider Nanetta Batty MD       Encounter Date: 09/23/2023  Check In:  Session Check In - 09/23/23 0918       Check-In   Supervising physician immediately available to respond to emergencies See telemetry face sheet for immediately available MD    Location [facility]    Staff Present Ross Ludwig, BS, Exercise Physiologist;Alyric Parkin [place] BSN, RN;Jessica [place], MA, RCEP, CCRP, Dow Adolph, RN, BSN    Virtual Visit No    Medication changes reported     No    Fall or balance concerns reported    No    Tobacco Cessation No Change    Warm-up and Cool-down Performed on first and last piece of equipment    Resistance Training Performed Yes    VAD Patient? No    PAD/SET Patient? No      Pain Assessment   Currently in Pain? No/denies    Multiple Pain Sites No             Capillary Blood Glucose: Results for orders placed or performed during the hospital encounter of 09/23/23 (from the past 24 hour(s))  Glucose, capillary     Status: Abnormal   Collection Time: 09/23/23  9:25 AM  Result Value Ref Range   Glucose-Capillary 148 (H) 70 - 99 mg/dL      Social History   Tobacco Use  Smoking Status Former   Current packs/day: 1.00   Average packs/day: 1 pack/day for 43.8 years (43.8 ttl pk-yrs)   Types: Cigarettes   Start date: 20  Smokeless Tobacco Former  Tobacco Comments   Quit date 06/27/2023    Goals Met:  Independence with exercise equipment Exercise tolerated well No report of concerns or symptoms today Strength training completed today  Goals Unmet:  Not Applicable  Comments: Marland KitchenMarland KitchenFirst full day of exercise!  Patient was oriented to gym and equipment including functions, settings, policies,  and procedures.  Patient's individual exercise prescription and treatment plan were reviewed.  All starting workloads were established based on the results of the 6 minute walk test done at initial orientation visit.  The plan for exercise progression was also introduced and progression will be customized based on patient's performance and goals.    Dr. Dina Rich is Medical Director for [facility] Cardiac Rehab

## 2023-09-25 ENCOUNTER — Encounter (HOSPITAL_COMMUNITY): Payer: Medicaid Other

## 2023-09-28 ENCOUNTER — Encounter (HOSPITAL_COMMUNITY): Payer: Medicaid Other

## 2023-09-28 ENCOUNTER — Inpatient Hospital Stay (HOSPITAL_COMMUNITY)
Admission: RE | Admit: 2023-09-28 | Discharge: 2023-09-28 | Discharge status: Home / Self Care | Payer: Medicaid Other | Source: Ambulatory Visit | Attending: Cardiovascular Disease

## 2023-09-28 DIAGNOSIS — I214 Non-ST elevation (NSTEMI) myocardial infarction: Secondary | ICD-10-CM | POA: Diagnosis present

## 2023-09-28 DIAGNOSIS — Z955 Presence of coronary angioplasty implant and graft: Secondary | ICD-10-CM

## 2023-09-28 LAB — GLUCOSE, CAPILLARY
Glucose-Capillary: 148 mg/dL — ABNORMAL HIGH (ref 70–99)
Glucose-Capillary: 134 mg/dL — ABNORMAL HIGH (ref 70–99)

## 2023-09-28 NOTE — Progress Notes (Signed)
Daily Session Note  Patient Details  Name: Seth [name] MRN: [id number] Date of Birth: September 22, 1961 Referring Provider:   Flowsheet Row CARDIAC REHAB PHASE II ORIENTATION from 09/21/2023 in [facility] CARDIAC REHABILITATION  Referring Provider Nanetta Batty MD       Encounter Date: 09/28/2023  Check In:  Session Check In - 09/28/23 0900       Check-In   Supervising physician immediately available to respond to emergencies See telemetry face sheet for immediately available ER MD    Staff Present Rodena Medin, RN, Pleas Koch, RN, BSN;Jessica Hawkins, MA, RCEP, CCRP, CCET    Virtual Visit No    Medication changes reported     No    Fall or balance concerns reported    No    Warm-up and Cool-down Performed on first and last piece of equipment    Resistance Training Performed Yes    VAD Patient? No    PAD/SET Patient? No      Pain Assessment   Currently in Pain? No/denies    Multiple Pain Sites No             Capillary Blood Glucose: Results for orders placed or performed during the hospital encounter of 09/28/23 (from the past 24 hour(s))  Glucose, capillary     Status: Abnormal   Collection Time: 09/28/23  9:09 AM  Result Value Ref Range   Glucose-Capillary 148 (H) 70 - 99 mg/dL      Social History   Tobacco Use  Smoking Status Former   Current packs/day: 1.00   Average packs/day: 1 pack/day for 43.8 years (43.8 ttl pk-yrs)   Types: Cigarettes   Start date: 50  Smokeless Tobacco Former  Tobacco Comments   Quit date 06/27/2023    Goals Met:  Independence with exercise equipment Exercise tolerated well No report of concerns or symptoms today Strength training completed today  Goals Unmet:  Not Applicable  Comments: Pt able to follow exercise prescription today without complaint.  Will continue to monitor for progression.    Dr. Dina Rich is Medical Director for [facility] Cardiac Rehab

## 2023-09-30 ENCOUNTER — Encounter (HOSPITAL_COMMUNITY): Payer: Self-pay | Admitting: *Deleted

## 2023-09-30 ENCOUNTER — Encounter (HOSPITAL_COMMUNITY)
Admission: RE | Admit: 2023-09-30 | Discharge: 2023-09-30 | Disposition: A | Discharge status: Home / Self Care | Payer: Medicaid Other | Source: Ambulatory Visit | Attending: Cardiovascular Disease | Admitting: Cardiovascular Disease

## 2023-09-30 ENCOUNTER — Encounter (HOSPITAL_COMMUNITY): Payer: Medicaid Other

## 2023-09-30 DIAGNOSIS — I214 Non-ST elevation (NSTEMI) myocardial infarction: Secondary | ICD-10-CM

## 2023-09-30 DIAGNOSIS — Z955 Presence of coronary angioplasty implant and graft: Secondary | ICD-10-CM

## 2023-09-30 NOTE — Progress Notes (Signed)
Cardiac Individual Treatment Plan  Patient Details  Name: Seth [name] MRN: [id number] Date of Birth: Dec 29, 1960 Referring Provider:   Flowsheet Row CARDIAC REHAB PHASE II ORIENTATION from 09/21/2023 in [facility] CARDIAC REHABILITATION  Referring Provider Nanetta Batty MD       Initial Encounter Date:  Flowsheet Row CARDIAC REHAB PHASE II ORIENTATION from 09/21/2023 in [place] CARDIAC REHABILITATION  Date 09/21/23       Visit Diagnosis: NSTEMI (non-ST elevated myocardial infarction) [facility])  Status post coronary artery stent placement  Patient's Home Medications on Admission:  Current Outpatient Medications:    aspirin EC 81 MG tablet, Take 1 tablet (81 mg total) by mouth daily. Swallow whole., Disp: 30 tablet, Rfl: 2   atorvastatin (LIPITOR) 80 MG tablet, Take 1 tablet (80 mg total) by mouth daily., Disp: 90 tablet, Rfl: 1   carvedilol (COREG) 6.25 MG tablet, Take 1 tablet (6.25 mg total) by mouth 2 (two) times daily with a meal. (Patient not taking: Reported on 09/16/2023), Disp: 180 tablet, Rfl: 1   clopidogrel (PLAVIX) 75 MG tablet, Take 1 tablet (75 mg total) by mouth daily., Disp: 90 tablet, Rfl: 2   empagliflozin (JARDIANCE) 10 MG TABS tablet, Take 1 tablet (10 mg total) by mouth daily., Disp: 30 tablet, Rfl: 3   ibuprofen (ADVIL) 200 MG tablet, Take 200 mg by mouth every 6 (six) hours as needed for headache or mild pain., Disp: , Rfl:    losartan (COZAAR) 25 MG tablet, Take 1 tablet (25 mg total) by mouth daily., Disp: 90 tablet, Rfl: 1   Multiple Vitamin (MULTIVITAMIN WITH MINERALS) TABS tablet, Take 1 tablet by mouth daily., Disp: , Rfl:    nitroGLYCERIN (NITROSTAT) 0.4 MG SL tablet, Place 1 tablet (0.4 mg total) under the tongue every 5 (five) minutes x 3 doses as needed for chest pain., Disp: 25 tablet, Rfl: 2   Nutritional Supplements (PROSTATE PO), Take 1 tablet by mouth daily. (Patient not taking: Reported on 09/16/2023), Disp: , Rfl:    tamsulosin (FLOMAX) 0.4  MG CAPS capsule, Take 0.4 mg by mouth daily., Disp: , Rfl:   Past Medical History: Past Medical History:  Diagnosis Date   BPH (benign prostatic hyperplasia)    Hyperlipidemia    Hypertension    NSTEMI (non-ST elevated myocardial infarction) (HCC)    PONV (postoperative nausea and vomiting)    Type 2 diabetes mellitus (HCC)     Tobacco Use: Social History   Tobacco Use  Smoking Status Former   Current packs/day: 1.00   Average packs/day: 1 pack/day for 43.8 years (43.8 ttl pk-yrs)   Types: Cigarettes   Start date: 51  Smokeless Tobacco Former  Tobacco Comments   Quit date 06/27/2023    Labs: Review Flowsheet  More data exists      Latest Ref Rng & Units 02/13/2010 03/08/2010 06/27/2023 06/28/2023 06/29/2023  Labs for ITP Cardiac and Pulmonary Rehab  Cholestrol 0 - 200 mg/dL 956        ATP III CLASSIFICATION:  <200     mg/dL   Desirable  213-086  mg/dL   Borderline High  >=578    mg/dL   High         - - 469  98   LDL (calc) 0 - 99 mg/dL 629        Total Cholesterol/HDL:CHD Risk Coronary Heart Disease Risk Table  Men   Women  1/2 Average Risk   3.4   3.3  Average Risk       5.0   4.4  2 X Average Risk   9.6   7.1  3 X Average Risk  23.4   11.0        Use the calculated Patient Ratio above and the CHD Risk Table to determine the patient's CHD Risk.        ATP III CLASSIFICATION (LDL):  <100     mg/dL   Optimal  884-166  mg/dL   Near or Above                    Optimal  130-159  mg/dL   Borderline  063-016  mg/dL   High  >010     mg/dL   Very High  - - 72  65   HDL-C >40 mg/dL 35  - - 33  14   Trlycerides <150 mg/dL 932  - - 355  95   Hemoglobin A1c 4.8 - 5.6 % 5.9 (NOTE) The ADA recommends the following therapeutic goal for glycemic control related to Hgb A1c measurement: Goal of therapy: <6.5 Hgb A1c  Reference: American Diabetes Association: Clinical Practice Recommendations 2010, Diabetes Care, 2010, 33: (Suppl  1).  5.9  6.8  - -     Details            Capillary Blood Glucose: Lab Results  Component Value Date   GLUCAP 134 (H) 09/28/2023   GLUCAP 148 (H) 09/28/2023   GLUCAP 97 09/23/2023   GLUCAP 148 (H) 09/23/2023   GLUCAP 165 (H) 06/30/2023     Exercise Target Goals: Exercise Program Goal: Individual exercise prescription set using results from initial 6 min walk test and THRR while considering  patient's activity barriers and safety.   Exercise Prescription Goal: Starting with aerobic activity 30 plus minutes a day, 3 days per week for initial exercise prescription. Provide home exercise prescription and guidelines that participant acknowledges understanding prior to discharge.  Activity Barriers & Risk Stratification:  Activity Barriers & Cardiac Risk Stratification - 09/21/23 1348       Activity Barriers & Cardiac Risk Stratification   Activity Barriers Shortness of Breath;Arthritis;Joint Problems;Balance Concerns   chronic bad knees, hips, shoulders from construction work   Cardiac Risk Stratification Moderate             6 Minute Walk:  6 Minute Walk     Row Name 09/21/23 1346         6 Minute Walk   Phase Initial     Distance 1637 feet     Walk Time 6 minutes     # of Rest Breaks 0     MPH 3.1     METS 3.97     RPE 9     VO2 Peak 13.89     Symptoms No     Resting HR 65 bpm     Resting BP 108/66     Resting Oxygen Saturation  94 %     Exercise Oxygen Saturation  during 6 min walk 95 %     Max Ex. HR 102 bpm     Max Ex. BP 128/74     2 Minute Post BP 112/66              Oxygen Initial Assessment:   Oxygen Re-Evaluation:   Oxygen Discharge (Final Oxygen Re-Evaluation):   Initial Exercise Prescription:  Initial Exercise Prescription - 09/21/23 1300       Date of Initial Exercise RX and Referring Provider   Date 09/21/23    Referring Provider Nanetta Batty MD      Oxygen   Maintain Oxygen Saturation 88% or higher      Treadmill   MPH 3.1    Grade 1     Minutes 15    METs 3.8      REL-XR   Level 3    Speed 50    Minutes 15    METs 3.5      Prescription Details   Frequency (times per week) 3    Duration Progress to 30 minutes of continuous aerobic without signs/symptoms of physical distress      Intensity   THRR 40-80% of Max Heartrate 103-140    Ratings of Perceived Exertion 11-13    Perceived Dyspnea 0-4      Progression   Progression Continue to progress workloads to maintain intensity without signs/symptoms of physical distress.      Resistance Training   Training Prescription Yes    Weight 5 lb    Reps 10-15             Perform Capillary Blood Glucose checks as needed.  Exercise Prescription Changes:   Exercise Prescription Changes     Row Name 09/21/23 1300             Response to Exercise   Blood Pressure (Admit) 108/66       Blood Pressure (Exercise) 128/74       Blood Pressure (Exit) 112/66       Heart Rate (Admit) 65 bpm       Heart Rate (Exercise) 102 bpm       Heart Rate (Exit) 67 bpm       Oxygen Saturation (Admit) 94 %       Oxygen Saturation (Exercise) 95 %       Rating of Perceived Exertion (Exercise) 9       Symptoms none       Comments walk test results                Exercise Comments:   Exercise Comments     Row Name 09/23/23 1003           Exercise Comments First full day of exercise!  Patient was oriented to gym and equipment including functions, settings, policies, and procedures.  Patient's individual exercise prescription and treatment plan were reviewed.  All starting workloads were established based on the results of the 6 minute walk test done at initial orientation visit.  The plan for exercise progression was also introduced and progression will be customized based on patient's performance and goals.                Exercise Goals and Review:   Exercise Goals     Row Name 09/21/23 1357             Exercise Goals   Increase Physical Activity Yes        Intervention Develop an individualized exercise prescription for aerobic and resistive training based on initial evaluation findings, risk stratification, comorbidities and participant's personal goals.;Provide advice, education, support and counseling about physical activity/exercise needs.       Expected Outcomes Short Term: Attend rehab on a regular basis to increase amount of physical activity.;Long Term: Add in home exercise to make exercise part of routine and to increase amount of  physical activity.;Long Term: Exercising regularly at least 3-5 days a week.       Increase Strength and Stamina Yes       Intervention Provide advice, education, support and counseling about physical activity/exercise needs.;Develop an individualized exercise prescription for aerobic and resistive training based on initial evaluation findings, risk stratification, comorbidities and participant's personal goals.       Expected Outcomes Short Term: Increase workloads from initial exercise prescription for resistance, speed, and METs.;Long Term: Improve cardiorespiratory fitness, muscular endurance and strength as measured by increased METs and functional capacity ( );Short Term: Perform resistance training exercises routinely during rehab and add in resistance training at home       Able to understand and use rate of perceived exertion (RPE) scale Yes       Intervention Provide education and explanation on how to use RPE scale       Expected Outcomes Short Term: Able to use RPE daily in rehab to express subjective intensity level;Long Term:  Able to use RPE to guide intensity level when exercising independently       Able to understand and use Dyspnea scale Yes       Intervention Provide education and explanation on how to use Dyspnea scale       Expected Outcomes Short Term: Able to use Dyspnea scale daily in rehab to express subjective sense of shortness of breath during exertion;Long Term: Able to use Dyspnea  scale to guide intensity level when exercising independently       Knowledge and understanding of Target Heart Rate Range (THRR) Yes       Intervention Provide education and explanation of THRR including how the numbers were predicted and where they are located for reference       Expected Outcomes Short Term: Able to state/look up THRR;Long Term: Able to use THRR to govern intensity when exercising independently;Short Term: Able to use daily as guideline for intensity in rehab       Able to check pulse independently Yes       Intervention Review the importance of being able to check your own pulse for safety during independent exercise;Provide education and demonstration on how to check pulse in carotid and radial arteries.       Expected Outcomes Short Term: Able to explain why pulse checking is important during independent exercise;Long Term: Able to check pulse independently and accurately       Understanding of Exercise Prescription Yes       Intervention Provide education, explanation, and written materials on patient's individual exercise prescription       Expected Outcomes Short Term: Able to explain program exercise prescription;Long Term: Able to explain home exercise prescription to exercise independently                Exercise Goals Re-Evaluation :  Exercise Goals Re-Evaluation     Row Name 09/23/23 1003             Exercise Goal Re-Evaluation   Exercise Goals Review Able to understand and use rate of perceived exertion (RPE) scale;Able to understand and use Dyspnea scale;Knowledge and understanding of Target Heart Rate Range (THRR);Understanding of Exercise Prescription       Comments Reviewed RPE and dyspnea scale, THR and program prescription with pt today.  Pt voiced understanding and was given a copy of goals to take home.       Expected Outcomes Short: Use RPE daily to regulate intensity.  Long: Follow program prescription in  THR.                 Discharge  Exercise Prescription (Final Exercise Prescription Changes):  Exercise Prescription Changes - 09/21/23 1300       Response to Exercise   Blood Pressure (Admit) 108/66    Blood Pressure (Exercise) 128/74    Blood Pressure (Exit) 112/66    Heart Rate (Admit) 65 bpm    Heart Rate (Exercise) 102 bpm    Heart Rate (Exit) 67 bpm    Oxygen Saturation (Admit) 94 %    Oxygen Saturation (Exercise) 95 %    Rating of Perceived Exertion (Exercise) 9    Symptoms none    Comments walk test results             Nutrition:  Target Goals: Understanding of nutrition guidelines, daily intake of sodium 1500mg , cholesterol 200mg , calories 30% from fat and 7% or less from saturated fats, daily to have 5 or more servings of fruits and vegetables.  Biometrics:  Pre Biometrics - 09/21/23 1358       Pre Biometrics   Height 6\' 2"  (1.88 m)    Weight 227 lb 1.6 oz (103 kg)    Waist Circumference 40 inches    Hip Circumference 41.5 inches    Waist to Hip Ratio 0.96 %    BMI (Calculated) 29.15    Grip Strength 37.8 kg    Single Leg Stand 4.2 seconds              Nutrition Therapy Plan and Nutrition Goals:  Nutrition Therapy & Goals - 09/21/23 1359       Intervention Plan   Intervention Prescribe, educate and counsel regarding individualized specific dietary modifications aiming towards targeted core components such as weight, hypertension, lipid management, diabetes, heart failure and other comorbidities.    Expected Outcomes Short Term Goal: Understand basic principles of dietary content, such as calories, fat, sodium, cholesterol and nutrients.;Long Term Goal: Adherence to prescribed nutrition plan.             Nutrition Assessments:  MEDIFICTS Score Key: >=70 Need to make dietary changes  40-70 Heart Healthy Diet <= 40 Therapeutic Level Cholesterol Diet   Picture Your Plate Scores: <03 Unhealthy dietary pattern with much room for improvement. 41-50 Dietary pattern unlikely  to meet recommendations for good health and room for improvement. 51-60 More healthful dietary pattern, with some room for improvement.  >60 Healthy dietary pattern, although there may be some specific behaviors that could be improved.    Nutrition Goals Re-Evaluation:   Nutrition Goals Discharge (Final Nutrition Goals Re-Evaluation):   Psychosocial: Target Goals: Acknowledge presence or absence of significant depression and/or stress, maximize coping skills, provide positive support system. Participant is able to verbalize types and ability to use techniques and skills needed for reducing stress and depression.  Initial Review & Psychosocial Screening:  Initial Psych Review & Screening - 09/16/23 1040       Initial Review   Current issues with None Identified      Family Dynamics   Good Support System? Yes      Barriers   Psychosocial barriers to participate in program There are no identifiable barriers or psychosocial needs.;The patient should benefit from training in stress management and relaxation.      Screening Interventions   Interventions To provide support and resources with identified psychosocial needs;Provide feedback about the scores to participant;Encouraged to exercise    Expected Outcomes Short Term goal: Utilizing psychosocial counselor,  staff and physician to assist with identification of specific Stressors or current issues interfering with healing process. Setting desired goal for each stressor or current issue identified.;Long Term Goal: Stressors or current issues are controlled or eliminated.;Short Term goal: Identification and review with participant of any Quality of Life or Depression concerns found by scoring the questionnaire.;Long Term goal: The participant improves quality of Life and PHQ9 Scores as seen by post scores and/or verbalization of changes             Quality of Life Scores:  Quality of Life - 09/21/23 1359       Quality of Life    Select Quality of Life      Quality of Life Scores   Health/Function Pre 24.46 %    Socioeconomic Pre 26.81 %    Psych/Spiritual Pre 24.29 %    Family Pre 26 %    GLOBAL Pre 25.25 %            Scores of 19 and below usually indicate a poorer quality of life in these areas.  A difference of  2-3 points is a clinically meaningful difference.  A difference of 2-3 points in the total score of the Quality of Life Index has been associated with significant improvement in overall quality of life, self-image, physical symptoms, and general health in studies assessing change in quality of life.  PHQ-9: Review Flowsheet       09/21/2023 08/20/2023  Depression screen PHQ 2/9  Decreased Interest 0 0  Down, Depressed, Hopeless 0 0  PHQ - 2 Score 0 0  Altered sleeping 0 0  Tired, decreased energy 0 0  Change in appetite 0 0  Feeling bad or failure about yourself  0 0  Trouble concentrating 0 0  Moving slowly or fidgety/restless 0 0  Suicidal thoughts 0 0  PHQ-9 Score 0 0  Difficult doing work/chores Not difficult at all Not difficult at all    Details           Interpretation of Total Score  Total Score Depression Severity:  1-4 = Minimal depression, 5-9 = Mild depression, 10-14 = Moderate depression, 15-19 = Moderately severe depression, 20-27 = Severe depression   Psychosocial Evaluation and Intervention:  Psychosocial Evaluation - 09/16/23 1042       Psychosocial Evaluation & Interventions   Interventions Stress management education;Encouraged to exercise with the program and follow exercise prescription    Comments Patient was referred to Cr with NSTEMI and stent placement 06/27/23. His CR was delayed due to not having health insurance. He denies any depression or anxiety. He has been out of work since his event and is ready to go back. He has been released to start back Tuesday 10/1. He works at a [employer] in [place]. He says he is very active on his job. He  denies any stressors in his life. He says his mother passed away in 2023/06/29. He lives alone but has good support from his sister and some close friends. His main goals for the program are to learn what he is able to do; make sure his heart is working okay and to get back to his normal life. He says he sleeps well at night. 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 He is not sure how much of the program he wants to do due to having to miss work but has committed to at least 21 sessions and  will "see how it goes".  His work schedule could be a possible barriers for him to complete 36 sessions.    Expected Outcomes Short Term: start the program and attend consistenly. Long Term: Meet his personal goals.    Continue Psychosocial Services  Follow up required by staff             Psychosocial Re-Evaluation:   Psychosocial Discharge (Final Psychosocial Re-Evaluation):   Vocational Rehabilitation: Provide vocational rehab assistance to qualifying candidates.   Vocational Rehab Evaluation & Intervention:  Vocational Rehab - 09/16/23 1041       Initial Vocational Rehab Evaluation & Intervention   Assessment shows need for Vocational Rehabilitation No      Vocational Rehab Re-Evaulation   Comments Patient is returning to work next week at a [employer].             Education: Education Goals: Education classes will be provided on a weekly basis, covering required topics. Participant will state understanding/return demonstration of topics presented.  Learning Barriers/Preferences:  Learning Barriers/Preferences - 09/16/23 1040       Learning Barriers/Preferences   Learning Barriers None    Learning Preferences Skilled Demonstration             Education Topics: Hypertension, Hypertension Reduction -Define heart disease and high blood pressure. Discus how high blood pressure affects the body and ways to reduce high  blood pressure.   Exercise and Your Heart -Discuss why it is important to exercise, the FITT principles of exercise, normal and abnormal responses to exercise, and how to exercise safely.   Angina -Discuss definition of angina, causes of angina, treatment of angina, and how to decrease risk of having angina.   Cardiac Medications -Review what the following cardiac medications are used for, how they affect the body, and side effects that may occur when taking the medications.  Medications include Aspirin, Beta blockers, calcium channel blockers, ACE Inhibitors, angiotensin receptor blockers, diuretics, digoxin, and antihyperlipidemics.   Congestive Heart Failure -Discuss the definition of CHF, how to live with CHF, the signs and symptoms of CHF, and how keep track of weight and sodium intake.   Heart Disease and Intimacy -Discus the effect sexual activity has on the heart, how changes occur during intimacy as we age, and safety during sexual activity. Flowsheet Row CARDIAC REHAB PHASE II EXERCISE from 09/23/2023 in [place] CARDIAC REHABILITATION  Date 09/23/23  Educator HB  Instruction Review Code 1- Verbalizes Understanding       Smoking Cessation / COPD -Discuss different methods to quit smoking, the health benefits of quitting smoking, and the definition of COPD.   Nutrition I: Fats -Discuss the types of cholesterol, what cholesterol does to the heart, and how cholesterol levels can be controlled.   Nutrition II: Labels -Discuss the different components of food labels and how to read food label   Heart Parts/Heart Disease and PAD -Discuss the anatomy of the heart, the pathway of blood circulation through the heart, and these are affected by heart disease.   Stress I: Signs and Symptoms -Discuss the causes of stress, how stress may lead to anxiety and depression, and ways to limit stress.   Stress II: Relaxation -Discuss different types of relaxation techniques to  limit stress.   Warning Signs of Stroke / TIA -Discuss definition of a stroke, what the signs and symptoms are of a stroke, and how to identify when someone is having stroke.   Knowledge Questionnaire Score:  Knowledge Questionnaire Score -  09/21/23 1359       Knowledge Questionnaire Score   Pre Score 20/24             Core Components/Risk Factors/Patient Goals at Admission:  Personal Goals and Risk Factors at Admission - 09/21/23 1359       Core Components/Risk Factors/Patient Goals on Admission    Weight Management Weight Maintenance;Yes;Weight Loss    Intervention Weight Management: Develop a combined nutrition and exercise program designed to reach desired caloric intake, while maintaining appropriate intake of nutrient and fiber, sodium and fats, and appropriate energy expenditure required for the weight goal.;Weight Management: Provide education and appropriate resources to help participant work on and attain dietary goals.    Admit Weight 227 lb 1.6 oz (103 kg)    Goal Weight: Short Term 220 lb (99.8 kg)    Goal Weight: Long Term 215 lb (97.5 kg)    Expected Outcomes Short Term: Continue to assess and modify interventions until short term weight is achieved;Long Term: Adherence to nutrition and physical activity/exercise program aimed toward attainment of established weight goal;Weight Loss: Understanding of general recommendations for a balanced deficit meal plan, which promotes 1-2 lb weight loss per week and includes a negative energy balance of [phone] kcal/d;Understanding recommendations for meals to include 15-35% energy as protein, 25-35% energy from fat, 35-60% energy from carbohydrates, less than 200mg  of dietary cholesterol, 20-35 gm of total fiber daily;Understanding of distribution of calorie intake throughout the day with the consumption of 4-5 meals/snacks    Tobacco Cessation Yes   quit in July   Intervention Assist the participant in steps to quit. Provide  individualized education and counseling about committing to Tobacco Cessation, relapse prevention, and pharmacological support that can be provided by physician.    Expected Outcomes Long Term: Complete abstinence from all tobacco products for at least 12 months from quit date.    Improve shortness of breath with ADL's Yes    Intervention Provide education, individualized exercise plan and daily activity instruction to help decrease symptoms of SOB with activities of daily living.    Expected Outcomes Short Term: Improve cardiorespiratory fitness to achieve a reduction of symptoms when performing ADLs;Long Term: Be able to perform more ADLs without symptoms or delay the onset of symptoms    Diabetes Yes    Intervention Provide education about signs/symptoms and action to take for hypo/hyperglycemia.;Provide education about proper nutrition, including hydration, and aerobic/resistive exercise prescription along with prescribed medications to achieve blood glucose in normal ranges: Fasting glucose 65-99 mg/dL    Expected Outcomes Short Term: Participant verbalizes understanding of the signs/symptoms and immediate care of hyper/hypoglycemia, proper foot care and importance of medication, aerobic/resistive exercise and nutrition plan for blood glucose control.;Long Term: Attainment of HbA1C < 7%.    Heart Failure Yes    Intervention Provide a combined exercise and nutrition program that is supplemented with education, support and counseling about heart failure. Directed toward relieving symptoms such as shortness of breath, decreased exercise tolerance, and extremity edema.    Expected Outcomes Improve functional capacity of life;Short term: Attendance in program 2-3 days a week with increased exercise capacity. Reported lower sodium intake. Reported increased fruit and vegetable intake. Reports medication compliance.;Short term: Daily weights obtained and reported for increase. Utilizing diuretic protocols  set by physician.;Long term: Adoption of self-care skills and reduction of barriers for early signs and symptoms recognition and intervention leading to self-care maintenance.    Hypertension Yes    Intervention Provide education on lifestyle  modifcations including regular physical activity/exercise, weight management, moderate sodium restriction and increased consumption of fresh fruit, vegetables, and low fat dairy, alcohol moderation, and smoking cessation.;Monitor prescription use compliance.    Expected Outcomes Short Term: Continued assessment and intervention until BP is < 140/17mm HG in hypertensive participants. < 130/35mm HG in hypertensive participants with diabetes, heart failure or chronic kidney disease.;Long Term: Maintenance of blood pressure at goal levels.    Lipids Yes    Intervention Provide education and support for participant on nutrition & aerobic/resistive exercise along with prescribed medications to achieve LDL 70mg , HDL >40mg .    Expected Outcomes Short Term: Participant states understanding of desired cholesterol values and is compliant with medications prescribed. Participant is following exercise prescription and nutrition guidelines.;Long Term: Cholesterol controlled with medications as prescribed, with individualized exercise RX and with personalized nutrition plan. Value goals: LDL < 70mg , HDL > 40 mg.             Core Components/Risk Factors/Patient Goals Review:   Goals and Risk Factor Review     Row Name 09/21/23 1403             Core Components/Risk Factors/Patient Goals Review   Personal Goals Review Tobacco Cessation       Review Seth [name] has recently quit tobacco use within the last 6 months. Intervention for relapse prevention was provided at the initial medical review. He was encouraged to continue to with tobacco cessation and has had no cravings.  Patient was encouraged to ask staff for assistance if cravings return.  Staff will continue to provide  encouragement and follow up with the patient throughout the program.       Expected Outcomes Continued cessation                Core Components/Risk Factors/Patient Goals at Discharge (Final Review):   Goals and Risk Factor Review - 09/21/23 1403       Core Components/Risk Factors/Patient Goals Review   Personal Goals Review Tobacco Cessation    Review Seth [name] has recently quit tobacco use within the last 6 months. Intervention for relapse prevention was provided at the initial medical review. He was encouraged to continue to with tobacco cessation and has had no cravings.  Patient was encouraged to ask staff for assistance if cravings return.  Staff will continue to provide encouragement and follow up with the patient throughout the program.    Expected Outcomes Continued cessation             ITP Comments:  ITP Comments     Row Name 09/21/23 1339 09/23/23 1003 09/30/23 0708       ITP Comments Patient attend orientation today.  Patient is attendingCardiac Rehabilitation Program.  Documentation for diagnosis can be found in [facility] encouner 06/27/23.  Reviewed medical chart, RPE/RPD, gym safety, and program guidelines.  Patient was fitted to equipment they will be using during rehab.  Patient is scheduled to start exercise on Wed 09/23/23.   Initial ITP created and sent for review and signature by Dr. Dina Rich, Medical Director for Cardiac Rehabilitation Program. First full day of exercise!  Patient was oriented to gym and equipment including functions, settings, policies, and procedures.  Patient's individual exercise prescription and treatment plan were reviewed.  All starting workloads were established based on the results of the 6 minute walk test done at initial orientation visit.  The plan for exercise progression was also introduced and progression will be customized based on patient's performance and goals. 30  day review completed. ITP sent to Dr. Dina Rich, Medical Director  of Cardiac Rehab. Continue with ITP unless changes are made by physician.  Pt is new to program this round.              Comments: 30 day review

## 2023-09-30 NOTE — Progress Notes (Signed)
Daily Session Note  Patient Details  Name: Seth [name] MRN: [id number] Date of Birth: [birth year] Referring Provider:   Flowsheet Row CARDIAC REHAB PHASE II ORIENTATION from 09/21/2023 in [facility] CARDIAC REHABILITATION  Referring Provider Nanetta Batty MD       Encounter Date: 09/30/2023  Check In:  Session Check In - 09/30/23 1009       Check-In   Supervising physician immediately available to respond to emergencies See telemetry face sheet for immediately available MD    Location [facility]    Staff Present Ross Ludwig, BS, Exercise Physiologist;Violia Knopf [place], MA, RCEP, CCRP, Dow Adolph, RN, BSN    Virtual Visit No    Medication changes reported     No    Fall or balance concerns reported    No    Warm-up and Cool-down Performed on first and last piece of equipment    Resistance Training Performed Yes    VAD Patient? No    PAD/SET Patient? No      Pain Assessment   Currently in Pain? No/denies             Capillary Blood Glucose: No results found for this or any previous visit (from the past 24 hour(s)).    Social History   Tobacco Use  Smoking Status Former   Current packs/day: 1.00   Average packs/day: 1 pack/day for 43.8 years (43.8 ttl pk-yrs)   Types: Cigarettes   Start date: 2  Smokeless Tobacco Former  Tobacco Comments   Quit date 06/27/2023    Goals Met:  Independence with exercise equipment Exercise tolerated well No report of concerns or symptoms today Strength training completed today  Goals Unmet:  Not Applicable  Comments: Pt able to follow exercise prescription today without complaint.  Will continue to monitor for progression.    Dr. Dina Rich is Medical Director for [facility] Cardiac [facility]

## 2023-10-02 ENCOUNTER — Encounter (HOSPITAL_COMMUNITY): Payer: Medicaid Other

## 2023-10-02 ENCOUNTER — Encounter (HOSPITAL_COMMUNITY)
Admission: RE | Admit: 2023-10-02 | Discharge: 2023-10-02 | Disposition: A | Discharge status: Home / Self Care | Payer: Medicaid Other | Source: Ambulatory Visit | Attending: Cardiovascular Disease | Admitting: Cardiovascular Disease

## 2023-10-02 DIAGNOSIS — I214 Non-ST elevation (NSTEMI) myocardial infarction: Secondary | ICD-10-CM

## 2023-10-02 DIAGNOSIS — Z955 Presence of coronary angioplasty implant and graft: Secondary | ICD-10-CM

## 2023-10-02 LAB — GLUCOSE, CAPILLARY: Glucose-Capillary: 127 mg/dL — ABNORMAL HIGH (ref 70–99)

## 2023-10-02 NOTE — Progress Notes (Signed)
Daily Session Note  Patient Details  Name: Seth [name] MRN: [id number] Date of Birth: [birth year] Referring Provider:   Flowsheet Row CARDIAC REHAB PHASE II ORIENTATION from 09/21/2023 in [facility] CARDIAC REHABILITATION  Referring Provider Nanetta Batty MD       Encounter Date: 10/02/2023  Check In:  Session Check In - 10/02/23 0900       Check-In   Supervising physician immediately available to respond to emergencies See telemetry face sheet for immediately available MD    Location [facility]    Staff Present Ross Ludwig, BS, Exercise Physiologist;Shreyansh Tiffany BSN, RN;Other    Virtual Visit No    Medication changes reported     No    Fall or balance concerns reported    No    Tobacco Cessation No Change    Warm-up and Cool-down Performed on first and last piece of equipment    Resistance Training Performed Yes    VAD Patient? No    PAD/SET Patient? No      Pain Assessment   Currently in Pain? No/denies    Multiple Pain Sites No             Capillary Blood Glucose: Results for orders placed or performed during the hospital encounter of 10/02/23 (from the past 24 hour(s))  Glucose, capillary     Status: Abnormal   Collection Time: 10/02/23  9:08 AM  Result Value Ref Range   Glucose-Capillary 127 (H) 70 - 99 mg/dL      Social History   Tobacco Use  Smoking Status Former   Current packs/day: 1.00   Average packs/day: 1 pack/day for 43.8 years (43.8 ttl pk-yrs)   Types: Cigarettes   Start date: 49  Smokeless Tobacco Former  Tobacco Comments   Quit date 06/27/2023    Goals Met:  Independence with exercise equipment Exercise tolerated well No report of concerns or symptoms today Strength training completed today  Goals Unmet:  Not Applicable  Comments: Marland KitchenMarland KitchenPt able to follow exercise prescription today without complaint.  Will continue to monitor for progression.

## 2023-10-05 ENCOUNTER — Ambulatory Visit (HOSPITAL_COMMUNITY): Payer: Medicaid Other

## 2023-10-05 ENCOUNTER — Encounter (HOSPITAL_COMMUNITY): Payer: Medicaid Other

## 2023-10-06 ENCOUNTER — Ambulatory Visit: Payer: Medicaid Other | Attending: Nurse Practitioner

## 2023-10-06 DIAGNOSIS — I5022 Chronic systolic (congestive) heart failure: Secondary | ICD-10-CM | POA: Diagnosis not present

## 2023-10-06 LAB — ECHOCARDIOGRAM COMPLETE
AR max vel: 3.01 cm2
AV Area VTI: 2.98 cm2
AV Area mean vel: 2.91 cm2
AV Mean grad: 6 mm[Hg]
AV Peak grad: 12.1 mm[Hg]
Ao pk vel: 1.74 m/s
Area-P 1/2: 3.1 cm2
Calc EF: 55.6 %
Est EF: 55
MV VTI: 2.99 cm2
S' Lateral: 4.1 cm
Single Plane A2C EF: 55 %
Single Plane A4C EF: 54 %

## 2023-10-07 ENCOUNTER — Ambulatory Visit (HOSPITAL_COMMUNITY): Payer: Medicaid Other

## 2023-10-07 ENCOUNTER — Encounter (HOSPITAL_COMMUNITY): Payer: Medicaid Other

## 2023-10-09 ENCOUNTER — Encounter (HOSPITAL_COMMUNITY): Payer: Medicaid Other

## 2023-10-09 ENCOUNTER — Ambulatory Visit (HOSPITAL_COMMUNITY): Payer: Medicaid Other

## 2023-10-12 ENCOUNTER — Encounter (HOSPITAL_COMMUNITY): Payer: Medicaid Other

## 2023-10-14 ENCOUNTER — Ambulatory Visit (HOSPITAL_COMMUNITY): Payer: Medicaid Other

## 2023-10-14 ENCOUNTER — Encounter (HOSPITAL_COMMUNITY): Payer: Medicaid Other

## 2023-10-16 ENCOUNTER — Encounter (HOSPITAL_COMMUNITY): Payer: Medicaid Other

## 2023-10-19 ENCOUNTER — Ambulatory Visit (HOSPITAL_COMMUNITY): Payer: Medicaid Other

## 2023-10-19 ENCOUNTER — Encounter (HOSPITAL_COMMUNITY): Payer: Medicaid Other

## 2023-10-20 ENCOUNTER — Encounter (HOSPITAL_COMMUNITY)
Admission: RE | Admit: 2023-10-20 | Discharge: 2023-10-20 | Disposition: A | Discharge status: Home / Self Care | Payer: Medicaid Other | Source: Ambulatory Visit | Attending: Cardiovascular Disease | Admitting: Cardiovascular Disease

## 2023-10-20 DIAGNOSIS — Z955 Presence of coronary angioplasty implant and graft: Secondary | ICD-10-CM

## 2023-10-20 DIAGNOSIS — I214 Non-ST elevation (NSTEMI) myocardial infarction: Secondary | ICD-10-CM

## 2023-10-20 NOTE — Progress Notes (Signed)
Daily Session Note  Patient Details  Name: DERROL SHARMAN MRN: [id number] Date of Birth: [birth year] Referring Provider:   Flowsheet Row CARDIAC REHAB PHASE II ORIENTATION from 09/21/2023 in [facility] CARDIAC REHABILITATION  Referring Provider Nanetta Batty MD       Encounter Date: 10/20/2023  Check In:  Session Check In - 10/20/23 1451       Check-In   Supervising physician immediately available to respond to emergencies See telemetry face sheet for immediately available MD    Location [facility]    Staff Present Ross Ludwig, BS, Exercise Physiologist;Hillary [place] BSN, RN;Kavir Savoca [place], MA, RCEP, CCRP, CCET    Virtual Visit No    Medication changes reported     No    Fall or balance concerns reported    No    Warm-up and Cool-down Performed on first and last piece of equipment    Resistance Training Performed Yes    VAD Patient? No    PAD/SET Patient? No      Pain Assessment   Currently in Pain? No/denies             Capillary Blood Glucose: No results found for this or any previous visit (from the past 24 hour(s)).    Social History   Tobacco Use  Smoking Status Former   Current packs/day: 1.00   Average packs/day: 1 pack/day for 43.8 years (43.8 ttl pk-yrs)   Types: Cigarettes   Start date: 57  Smokeless Tobacco Former  Tobacco Comments   Quit date 06/27/2023    Goals Met:  Independence with exercise equipment Exercise tolerated well No report of concerns or symptoms today Strength training completed today  Goals Unmet:  Not Applicable  Comments: Pt able to follow exercise prescription today without complaint.  Will continue to monitor for progression.

## 2023-10-21 ENCOUNTER — Encounter (HOSPITAL_COMMUNITY): Payer: Medicaid Other

## 2023-10-22 ENCOUNTER — Encounter (HOSPITAL_COMMUNITY)
Admission: RE | Admit: 2023-10-22 | Discharge: 2023-10-22 | Disposition: A | Discharge status: Home / Self Care | Payer: Medicaid Other | Source: Ambulatory Visit | Attending: Cardiovascular Disease | Admitting: Cardiovascular Disease

## 2023-10-22 DIAGNOSIS — I214 Non-ST elevation (NSTEMI) myocardial infarction: Secondary | ICD-10-CM

## 2023-10-22 DIAGNOSIS — Z955 Presence of coronary angioplasty implant and graft: Secondary | ICD-10-CM

## 2023-10-22 NOTE — Progress Notes (Signed)
Daily Session Note  Patient Details  Name: Seth [name] MRN: [id number] Date of Birth: 10-09-1961 Referring Provider:   Flowsheet Row CARDIAC REHAB PHASE II ORIENTATION from 09/21/2023 in [facility] CARDIAC REHABILITATION  Referring Provider Nanetta Batty MD       Encounter Date: 10/22/2023  Check In:  Session Check In - 10/22/23 1430       Check-In   Supervising physician immediately available to respond to emergencies See telemetry face sheet for immediately available MD    Location [facility]    Staff Present Ross Ludwig, BS, Exercise Physiologist;Jessica [place], MA, RCEP, CCRP, Dow Adolph, RN, Engineer, mining, RN    Virtual Visit No    Medication changes reported     No    Fall or balance concerns reported    No    Tobacco Cessation No Change    Warm-up and Cool-down Performed on first and last piece of equipment    Resistance Training Performed Yes    VAD Patient? No    PAD/SET Patient? No      Pain Assessment   Currently in Pain? No/denies    Multiple Pain Sites No             Capillary Blood Glucose: No results found for this or any previous visit (from the past 24 hour(s)).    Social History   Tobacco Use  Smoking Status Former   Current packs/day: 1.00   Average packs/day: 1 pack/day for 43.8 years (43.8 ttl pk-yrs)   Types: Cigarettes   Start date: 7  Smokeless Tobacco Former  Tobacco Comments   Quit date 06/27/2023    Goals Met:  Independence with exercise equipment Exercise tolerated well No report of concerns or symptoms today Strength training completed today  Goals Unmet:  Not Applicable  Comments: Pt able to follow exercise prescription today without complaint.  Will continue to monitor for progression.

## 2023-10-23 ENCOUNTER — Encounter (HOSPITAL_COMMUNITY): Payer: Medicaid Other

## 2023-10-26 ENCOUNTER — Encounter (HOSPITAL_COMMUNITY): Payer: Medicaid Other

## 2023-10-27 ENCOUNTER — Encounter (HOSPITAL_COMMUNITY): Payer: Medicaid Other

## 2023-10-28 ENCOUNTER — Encounter (HOSPITAL_COMMUNITY): Payer: Self-pay | Admitting: *Deleted

## 2023-10-28 ENCOUNTER — Encounter (HOSPITAL_COMMUNITY): Payer: Medicaid Other

## 2023-10-28 DIAGNOSIS — Z955 Presence of coronary angioplasty implant and graft: Secondary | ICD-10-CM

## 2023-10-28 DIAGNOSIS — I214 Non-ST elevation (NSTEMI) myocardial infarction: Secondary | ICD-10-CM

## 2023-10-28 NOTE — Progress Notes (Signed)
Cardiac Individual Treatment Plan  Patient Details  Name: BANNER HUCKABA MRN: [id number] Date of Birth: [birth year] Referring Provider:   Flowsheet Row CARDIAC REHAB PHASE II ORIENTATION from 09/21/2023 in [facility] CARDIAC REHABILITATION  Referring Provider Nanetta Batty MD       Initial Encounter Date:  Flowsheet Row CARDIAC REHAB PHASE II ORIENTATION from 09/21/2023 in [place] CARDIAC REHABILITATION  Date 09/21/23       Visit Diagnosis: NSTEMI (non-ST elevated myocardial infarction) [facility])  Status post coronary artery stent placement  Patient's Home Medications on Admission:  Current Outpatient Medications:    aspirin EC 81 MG tablet, Take 1 tablet (81 mg total) by mouth daily. Swallow whole., Disp: 30 tablet, Rfl: 2   atorvastatin (LIPITOR) 80 MG tablet, Take 1 tablet (80 mg total) by mouth daily., Disp: 90 tablet, Rfl: 1   carvedilol (COREG) 6.25 MG tablet, Take 1 tablet (6.25 mg total) by mouth 2 (two) times daily with a meal. (Patient not taking: Reported on 09/16/2023), Disp: 180 tablet, Rfl: 1   clopidogrel (PLAVIX) 75 MG tablet, Take 1 tablet (75 mg total) by mouth daily., Disp: 90 tablet, Rfl: 2   empagliflozin (JARDIANCE) 10 MG TABS tablet, Take 1 tablet (10 mg total) by mouth daily., Disp: 30 tablet, Rfl: 3   ibuprofen (ADVIL) 200 MG tablet, Take 200 mg by mouth every 6 (six) hours as needed for headache or mild pain., Disp: , Rfl:    losartan (COZAAR) 25 MG tablet, Take 1 tablet (25 mg total) by mouth daily., Disp: 90 tablet, Rfl: 1   Multiple Vitamin (MULTIVITAMIN WITH MINERALS) TABS tablet, Take 1 tablet by mouth daily., Disp: , Rfl:    nitroGLYCERIN (NITROSTAT) 0.4 MG SL tablet, Place 1 tablet (0.4 mg total) under the tongue every 5 (five) minutes x 3 doses as needed for chest pain., Disp: 25 tablet, Rfl: 2   Nutritional Supplements (PROSTATE PO), Take 1 tablet by mouth daily. (Patient not taking: Reported on 09/16/2023), Disp: , Rfl:    tamsulosin (FLOMAX) 0.4  MG CAPS capsule, Take 0.4 mg by mouth daily., Disp: , Rfl:   Past Medical History: Past Medical History:  Diagnosis Date   BPH (benign prostatic hyperplasia)    Hyperlipidemia    Hypertension    NSTEMI (non-ST elevated myocardial infarction) (HCC)    PONV (postoperative nausea and vomiting)    Type 2 diabetes mellitus (HCC)     Tobacco Use: Social History   Tobacco Use  Smoking Status Former   Current packs/day: 1.00   Average packs/day: 1 pack/day for 43.8 years (43.8 ttl pk-yrs)   Types: Cigarettes   Start date: 31  Smokeless Tobacco Former  Tobacco Comments   Quit date 06/27/2023    Labs: Review Flowsheet  More data exists      Latest Ref Rng & Units 02/13/2010 03/08/2010 06/27/2023 06/28/2023 06/29/2023  Labs for ITP Cardiac and Pulmonary Rehab  Cholestrol 0 - 200 mg/dL 657        ATP III CLASSIFICATION:  <200     mg/dL   Desirable  846-962  mg/dL   Borderline High  >=952    mg/dL   High         - - 841  98   LDL (calc) 0 - 99 mg/dL 324        Total Cholesterol/HDL:CHD Risk Coronary Heart Disease Risk Table  Men   Women  1/2 Average Risk   3.4   3.3  Average Risk       5.0   4.4  2 X Average Risk   9.6   7.1  3 X Average Risk  23.4   11.0        Use the calculated Patient Ratio above and the CHD Risk Table to determine the patient's CHD Risk.        ATP III CLASSIFICATION (LDL):  <100     mg/dL   Optimal  161-096  mg/dL   Near or Above                    Optimal  130-159  mg/dL   Borderline  045-409  mg/dL   High  >811     mg/dL   Very High  - - 72  65   HDL-C >40 mg/dL 35  - - 33  14   Trlycerides <150 mg/dL 914  - - 782  95   Hemoglobin A1c 4.8 - 5.6 % 5.9 (NOTE) The ADA recommends the following therapeutic goal for glycemic control related to Hgb A1c measurement: Goal of therapy: <6.5 Hgb A1c  Reference: American Diabetes Association: Clinical Practice Recommendations 2010, Diabetes Care, 2010, 33: (Suppl  1).  5.9  6.8  - -     Details            Capillary Blood Glucose: Lab Results  Component Value Date   GLUCAP 127 (H) 10/02/2023   GLUCAP 134 (H) 09/28/2023   GLUCAP 148 (H) 09/28/2023   GLUCAP 97 09/23/2023   GLUCAP 148 (H) 09/23/2023     Exercise Target Goals: Exercise Program Goal: Individual exercise prescription set using results from initial 6 min walk test and THRR while considering  patient's activity barriers and safety.   Exercise Prescription Goal: Starting with aerobic activity 30 plus minutes a day, 3 days per week for initial exercise prescription. Provide home exercise prescription and guidelines that participant acknowledges understanding prior to discharge.  Activity Barriers & Risk Stratification:  Activity Barriers & Cardiac Risk Stratification - 09/21/23 1348       Activity Barriers & Cardiac Risk Stratification   Activity Barriers Shortness of Breath;Arthritis;Joint Problems;Balance Concerns   chronic bad knees, hips, shoulders from construction work   Cardiac Risk Stratification Moderate             6 Minute Walk:  6 Minute Walk     Row Name 09/21/23 1346         6 Minute Walk   Phase Initial     Distance 1637 feet     Walk Time 6 minutes     # of Rest Breaks 0     MPH 3.1     METS 3.97     RPE 9     VO2 Peak 13.89     Symptoms No     Resting HR 65 bpm     Resting BP 108/66     Resting Oxygen Saturation  94 %     Exercise Oxygen Saturation  during 6 min walk 95 %     Max Ex. HR 102 bpm     Max Ex. BP 128/74     2 Minute Post BP 112/66              Oxygen Initial Assessment:   Oxygen Re-Evaluation:   Oxygen Discharge (Final Oxygen Re-Evaluation):   Initial Exercise Prescription:  Initial Exercise Prescription - 09/21/23 1300       Date of Initial Exercise RX and Referring Provider   Date 09/21/23    Referring Provider Nanetta Batty MD      Oxygen   Maintain Oxygen Saturation 88% or higher      Treadmill   MPH 3.1    Grade 1     Minutes 15    METs 3.8      REL-XR   Level 3    Speed 50    Minutes 15    METs 3.5      Prescription Details   Frequency (times per week) 3    Duration Progress to 30 minutes of continuous aerobic without signs/symptoms of physical distress      Intensity   THRR 40-80% of Max Heartrate 103-140    Ratings of Perceived Exertion 11-13    Perceived Dyspnea 0-4      Progression   Progression Continue to progress workloads to maintain intensity without signs/symptoms of physical distress.      Resistance Training   Training Prescription Yes    Weight 5 lb    Reps 10-15             Perform Capillary Blood Glucose checks as needed.  Exercise Prescription Changes:   Exercise Prescription Changes     Row Name 09/21/23 1300 10/22/23 1500           Response to Exercise   Blood Pressure (Admit) 108/66 126/72      Blood Pressure (Exercise) 128/74 146/72      Blood Pressure (Exit) 112/66 126/72      Heart Rate (Admit) 65 bpm 73 bpm      Heart Rate (Exercise) 102 bpm 118 bpm      Heart Rate (Exit) 67 bpm 75 bpm      Oxygen Saturation (Admit) 94 % --      Oxygen Saturation (Exercise) 95 % --      Rating of Perceived Exertion (Exercise) 9 13      Symptoms none --      Comments walk test results --      Duration -- Continue with 30 min of aerobic exercise without signs/symptoms of physical distress.      Intensity -- THRR unchanged        Progression   Progression -- Continue to progress workloads to maintain intensity without signs/symptoms of physical distress.        Resistance Training   Training Prescription -- Yes      Weight -- 5 lbs / blue band      Reps -- 10-15        Treadmill   MPH -- 3.5      Grade -- 2      Minutes -- 15      METs -- 4.65        NuStep   Level -- 5      SPM -- 60      Minutes -- 15      METs -- 2.8               Exercise Comments:   Exercise Comments     Row Name 09/23/23 1003           Exercise Comments First  full day of exercise!  Patient was oriented to gym and equipment including functions, settings, policies, and procedures.  Patient's individual exercise prescription and treatment plan were reviewed.  All starting workloads  were established based on the results of the 6 minute walk test done at initial orientation visit.  The plan for exercise progression was also introduced and progression will be customized based on patient's performance and goals.                Exercise Goals and Review:   Exercise Goals     Row Name 09/21/23 1357             Exercise Goals   Increase Physical Activity Yes       Intervention Develop an individualized exercise prescription for aerobic and resistive training based on initial evaluation findings, risk stratification, comorbidities and participant's personal goals.;Provide advice, education, support and counseling about physical activity/exercise needs.       Expected Outcomes Short Term: Attend rehab on a regular basis to increase amount of physical activity.;Long Term: Add in home exercise to make exercise part of routine and to increase amount of physical activity.;Long Term: Exercising regularly at least 3-5 days a week.       Increase Strength and Stamina Yes       Intervention Provide advice, education, support and counseling about physical activity/exercise needs.;Develop an individualized exercise prescription for aerobic and resistive training based on initial evaluation findings, risk stratification, comorbidities and participant's personal goals.       Expected Outcomes Short Term: Increase workloads from initial exercise prescription for resistance, speed, and METs.;Long Term: Improve cardiorespiratory fitness, muscular endurance and strength as measured by increased METs and functional capacity ( );Short Term: Perform resistance training exercises routinely during rehab and add in resistance training at home       Able to understand and use  rate of perceived exertion (RPE) scale Yes       Intervention Provide education and explanation on how to use RPE scale       Expected Outcomes Short Term: Able to use RPE daily in rehab to express subjective intensity level;Long Term:  Able to use RPE to guide intensity level when exercising independently       Able to understand and use Dyspnea scale Yes       Intervention Provide education and explanation on how to use Dyspnea scale       Expected Outcomes Short Term: Able to use Dyspnea scale daily in rehab to express subjective sense of shortness of breath during exertion;Long Term: Able to use Dyspnea scale to guide intensity level when exercising independently       Knowledge and understanding of Target Heart Rate Range (THRR) Yes       Intervention Provide education and explanation of THRR including how the numbers were predicted and where they are located for reference       Expected Outcomes Short Term: Able to state/look up THRR;Long Term: Able to use THRR to govern intensity when exercising independently;Short Term: Able to use daily as guideline for intensity in rehab       Able to check pulse independently Yes       Intervention Review the importance of being able to check your own pulse for safety during independent exercise;Provide education and demonstration on how to check pulse in carotid and radial arteries.       Expected Outcomes Short Term: Able to explain why pulse checking is important during independent exercise;Long Term: Able to check pulse independently and accurately       Understanding of Exercise Prescription Yes       Intervention Provide education, explanation, and written materials  on patient's individual exercise prescription       Expected Outcomes Short Term: Able to explain program exercise prescription;Long Term: Able to explain home exercise prescription to exercise independently                Exercise Goals Re-Evaluation :  Exercise Goals  Re-Evaluation     Row Name 09/23/23 1003 10/20/23 1458           Exercise Goal Re-Evaluation   Exercise Goals Review Able to understand and use rate of perceived exertion (RPE) scale;Able to understand and use Dyspnea scale;Knowledge and understanding of Target Heart Rate Range (THRR);Understanding of Exercise Prescription Increase Physical Activity;Increase Strength and Stamina;Understanding of Exercise Prescription      Comments Reviewed RPE and dyspnea scale, THR and program prescription with pt today.  Pt voiced understanding and was given a copy of goals to take home. Alinda Money is doing well in rehab. He has missed the last couple of weeks due to work conflicts.  He has now switched classes to be more accomodating to his schedule.  He has missed coming to class and not doing as much at home.  He is surprised that he is still going and not too tired in class.      Expected Outcomes Short: Use RPE daily to regulate intensity.  Long: Follow program prescription in THR. Short; Return to consistent attendance Long: Continue to improve stamina                Discharge Exercise Prescription (Final Exercise Prescription Changes):  Exercise Prescription Changes - 10/22/23 1500       Response to Exercise   Blood Pressure (Admit) 126/72    Blood Pressure (Exercise) 146/72    Blood Pressure (Exit) 126/72    Heart Rate (Admit) 73 bpm    Heart Rate (Exercise) 118 bpm    Heart Rate (Exit) 75 bpm    Rating of Perceived Exertion (Exercise) 13    Duration Continue with 30 min of aerobic exercise without signs/symptoms of physical distress.    Intensity THRR unchanged      Progression   Progression Continue to progress workloads to maintain intensity without signs/symptoms of physical distress.      Resistance Training   Training Prescription Yes    Weight 5 lbs / blue band    Reps 10-15      Treadmill   MPH 3.5    Grade 2    Minutes 15    METs 4.65      NuStep   Level 5    SPM 60     Minutes 15    METs 2.8             Nutrition:  Target Goals: Understanding of nutrition guidelines, daily intake of sodium 1500mg , cholesterol 200mg , calories 30% from fat and 7% or less from saturated fats, daily to have 5 or more servings of fruits and vegetables.  Biometrics:  Pre Biometrics - 09/21/23 1358       Pre Biometrics   Height 6\' 2"  (1.88 m)    Weight 227 lb 1.6 oz (103 kg)    Waist Circumference 40 inches    Hip Circumference 41.5 inches    Waist to Hip Ratio 0.96 %    BMI (Calculated) 29.15    Grip Strength 37.8 kg    Single Leg Stand 4.2 seconds              Nutrition Therapy Plan and Nutrition Goals:  Nutrition Therapy & Goals - 09/21/23 1359       Intervention Plan   Intervention Prescribe, educate and counsel regarding individualized specific dietary modifications aiming towards targeted core components such as weight, hypertension, lipid management, diabetes, heart failure and other comorbidities.    Expected Outcomes Short Term Goal: Understand basic principles of dietary content, such as calories, fat, sodium, cholesterol and nutrients.;Long Term Goal: Adherence to prescribed nutrition plan.             Nutrition Assessments:  MEDIFICTS Score Key: >=70 Need to make dietary changes  40-70 Heart Healthy Diet <= 40 Therapeutic Level Cholesterol Diet   Picture Your Plate Scores: <64 Unhealthy dietary pattern with much room for improvement. 41-50 Dietary pattern unlikely to meet recommendations for good health and room for improvement. 51-60 More healthful dietary pattern, with some room for improvement.  >60 Healthy dietary pattern, although there may be some specific behaviors that could be improved.    Nutrition Goals Re-Evaluation:  Nutrition Goals Re-Evaluation     Row Name 10/20/23 1502             Goals   Nutrition Goal Heart Healthy Diet       Comment Alinda Money is doing well in rehab.  He is trying to eat well for most  part.  But he tries to practice moderation when he eats something that is not as good for him.  He is eating more oatmeal now.  He is trying to limit his salt and sugar.  He is getting in lots of peanut butter and beans for protein source in addition to meat.  We talked about limiting his fatty meats.  He will continue to try to eat well.       Expected Outcome short: Continue to cut back on salt and sugar Long: Conitnue to try lean versions of protein                Nutrition Goals Discharge (Final Nutrition Goals Re-Evaluation):  Nutrition Goals Re-Evaluation - 10/20/23 1502       Goals   Nutrition Goal Heart Healthy Diet    Comment Alinda Money is doing well in rehab.  He is trying to eat well for most part.  But he tries to practice moderation when he eats something that is not as good for him.  He is eating more oatmeal now.  He is trying to limit his salt and sugar.  He is getting in lots of peanut butter and beans for protein source in addition to meat.  We talked about limiting his fatty meats.  He will continue to try to eat well.    Expected Outcome short: Continue to cut back on salt and sugar Long: Conitnue to try lean versions of protein             Psychosocial: Target Goals: Acknowledge presence or absence of significant depression and/or stress, maximize coping skills, provide positive support system. Participant is able to verbalize types and ability to use techniques and skills needed for reducing stress and depression.  Initial Review & Psychosocial Screening:  Initial Psych Review & Screening - 09/16/23 1040       Initial Review   Current issues with None Identified      Family Dynamics   Good Support System? Yes      Barriers   Psychosocial barriers to participate in program There are no identifiable barriers or psychosocial needs.;The patient should benefit from training in stress management and relaxation.  Screening Interventions   Interventions To provide  support and resources with identified psychosocial needs;Provide feedback about the scores to participant;Encouraged to exercise    Expected Outcomes Short Term goal: Utilizing psychosocial counselor, staff and physician to assist with identification of specific Stressors or current issues interfering with healing process. Setting desired goal for each stressor or current issue identified.;Long Term Goal: Stressors or current issues are controlled or eliminated.;Short Term goal: Identification and review with participant of any Quality of Life or Depression concerns found by scoring the questionnaire.;Long Term goal: The participant improves quality of Life and PHQ9 Scores as seen by post scores and/or verbalization of changes             Quality of Life Scores:  Quality of Life - 09/21/23 1359       Quality of Life   Select Quality of Life      Quality of Life Scores   Health/Function Pre 24.46 %    Socioeconomic Pre 26.81 %    Psych/Spiritual Pre 24.29 %    Family Pre 26 %    GLOBAL Pre 25.25 %            Scores of 19 and below usually indicate a poorer quality of life in these areas.  A difference of  2-3 points is a clinically meaningful difference.  A difference of 2-3 points in the total score of the Quality of Life Index has been associated with significant improvement in overall quality of life, self-image, physical symptoms, and general health in studies assessing change in quality of life.  PHQ-9: Review Flowsheet       09/21/2023 08/20/2023  Depression screen PHQ 2/9  Decreased Interest 0 0  Down, Depressed, Hopeless 0 0  PHQ - 2 Score 0 0  Altered sleeping 0 0  Tired, decreased energy 0 0  Change in appetite 0 0  Feeling bad or failure about yourself  0 0  Trouble concentrating 0 0  Moving slowly or fidgety/restless 0 0  Suicidal thoughts 0 0  PHQ-9 Score 0 0  Difficult doing work/chores Not difficult at all Not difficult at all    Details            Interpretation of Total Score  Total Score Depression Severity:  1-4 = Minimal depression, 5-9 = Mild depression, 10-14 = Moderate depression, 15-19 = Moderately severe depression, 20-27 = Severe depression   Psychosocial Evaluation and Intervention:  Psychosocial Evaluation - 09/16/23 1042       Psychosocial Evaluation & Interventions   Interventions Stress management education;Encouraged to exercise with the program and follow exercise prescription    Comments Patient was referred to Cr with NSTEMI and stent placement 06/27/23. His CR was delayed due to not having health insurance. He denies any depression or anxiety. He has been out of work since his event and is ready to go back. He has been released to start back Tuesday 10/1. He works at a [employer] in [place]. He says he is very active on his job. He denies any stressors in his life. He says his mother passed away in June 07, 2023. He lives alone but has good support from his sister and some close friends. His main goals for the program are to learn what he is able to do; make sure his heart is working okay and to get back to his normal life. He says he sleeps well at night. 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 He is not sure how much of the program he wants to do due to having to miss work but has committed to at least 21 sessions and will "see how it goes".  His work schedule could be a possible barriers for him to complete 36 sessions.    Expected Outcomes Short Term: start the program and attend consistenly. Long Term: Meet his personal goals.    Continue Psychosocial Services  Follow up required by staff             Psychosocial Re-Evaluation:  Psychosocial Re-Evaluation     Row Name 10/20/23 1501             Psychosocial Re-Evaluation   Current issues with Current Stress Concerns       Comments Alinda Money returned to rehab today after being out with work  conflicts.  He has switched classes to be accomodate his schedule. He got a good report from doctor and echo last week.  He is sleeping well.  He is generally positive and enjoys life and his job.       Expected Outcomes short: Conitnue to come to rehab to exercise Long: conitnue to exercise for mental boost       Interventions Encouraged to attend Cardiac Rehabilitation for the exercise       Continue Psychosocial Services  Follow up required by staff                Psychosocial Discharge (Final Psychosocial Re-Evaluation):  Psychosocial Re-Evaluation - 10/20/23 1501       Psychosocial Re-Evaluation   Current issues with Current Stress Concerns    Comments Alinda Money returned to rehab today after being out with work conflicts.  He has switched classes to be accomodate his schedule. He got a good report from doctor and echo last week.  He is sleeping well.  He is generally positive and enjoys life and his job.    Expected Outcomes short: Conitnue to come to rehab to exercise Long: conitnue to exercise for mental boost    Interventions Encouraged to attend Cardiac Rehabilitation for the exercise    Continue Psychosocial Services  Follow up required by staff             Vocational Rehabilitation: Provide vocational rehab assistance to qualifying candidates.   Vocational Rehab Evaluation & Intervention:  Vocational Rehab - 09/16/23 1041       Initial Vocational Rehab Evaluation & Intervention   Assessment shows need for Vocational Rehabilitation No      Vocational Rehab Re-Evaulation   Comments Patient is returning to work next week at a [employer].             Education: Education Goals: Education classes will be provided on a weekly basis, covering required topics. Participant will state understanding/return demonstration of topics presented.  Learning Barriers/Preferences:  Learning Barriers/Preferences - 09/16/23 1040       Learning Barriers/Preferences    Learning Barriers None    Learning Preferences Skilled Demonstration             Education Topics: Hypertension, Hypertension Reduction -Define heart disease and high blood pressure. Discus how high blood pressure affects the body and ways to reduce high blood pressure.   Exercise and Your Heart -Discuss why it is important to exercise, the FITT principles of exercise, normal and abnormal responses to exercise, and how to exercise safely. Flowsheet Row CARDIAC REHAB PHASE II EXERCISE from 10/22/2023 in [place]  CARDIAC REHABILITATION  Date 09/30/23  Alberteen Sam 2]  Educator [facility]  Instruction Review Code 1- Verbalizes Understanding       Angina -Discuss definition of angina, causes of angina, treatment of angina, and how to decrease risk of having angina.   Cardiac Medications -Review what the following cardiac medications are used for, how they affect the body, and side effects that may occur when taking the medications.  Medications include Aspirin, Beta blockers, calcium channel blockers, ACE Inhibitors, angiotensin receptor blockers, diuretics, digoxin, and antihyperlipidemics.   Congestive Heart Failure -Discuss the definition of CHF, how to live with CHF, the signs and symptoms of CHF, and how keep track of weight and sodium intake. Flowsheet Row CARDIAC REHAB PHASE II EXERCISE from 10/22/2023 in [place] CARDIAC REHABILITATION  Date 10/22/23  Educator jh  Instruction Review Code 1- Verbalizes Understanding       Heart Disease and Intimacy -Discus the effect sexual activity has on the heart, how changes occur during intimacy as we age, and safety during sexual activity. Flowsheet Row CARDIAC REHAB PHASE II EXERCISE from 10/22/2023 in [place] CARDIAC REHABILITATION  Date 09/23/23  Educator HB  Instruction Review Code 1- Verbalizes Understanding       Smoking Cessation / COPD -Discuss different methods to quit smoking, the health benefits of quitting smoking,  and the definition of COPD.   Nutrition I: Fats -Discuss the types of cholesterol, what cholesterol does to the heart, and how cholesterol levels can be controlled.   Nutrition II: Labels -Discuss the different components of food labels and how to read food label   Heart Parts/Heart Disease and PAD -Discuss the anatomy of the heart, the pathway of blood circulation through the heart, and these are affected by heart disease.   Stress I: Signs and Symptoms -Discuss the causes of stress, how stress may lead to anxiety and depression, and ways to limit stress.   Stress II: Relaxation -Discuss different types of relaxation techniques to limit stress.   Warning Signs of Stroke / TIA -Discuss definition of a stroke, what the signs and symptoms are of a stroke, and how to identify when someone is having stroke.   Knowledge Questionnaire Score:  Knowledge Questionnaire Score - 09/21/23 1359       Knowledge Questionnaire Score   Pre Score 20/24             Core Components/Risk Factors/Patient Goals at Admission:  Personal Goals and Risk Factors at Admission - 09/21/23 1359       Core Components/Risk Factors/Patient Goals on Admission    Weight Management Weight Maintenance;Yes;Weight Loss    Intervention Weight Management: Develop a combined nutrition and exercise program designed to reach desired caloric intake, while maintaining appropriate intake of nutrient and fiber, sodium and fats, and appropriate energy expenditure required for the weight goal.;Weight Management: Provide education and appropriate resources to help participant work on and attain dietary goals.    Admit Weight 227 lb 1.6 oz (103 kg)    Goal Weight: Short Term 220 lb (99.8 kg)    Goal Weight: Long Term 215 lb (97.5 kg)    Expected Outcomes Short Term: Continue to assess and modify interventions until short term weight is achieved;Long Term: Adherence to nutrition and physical activity/exercise program  aimed toward attainment of established weight goal;Weight Loss: Understanding of general recommendations for a balanced deficit meal plan, which promotes 1-2 lb weight loss per week and includes a negative energy balance of [phone] kcal/d;Understanding recommendations for meals  to include 15-35% energy as protein, 25-35% energy from fat, 35-60% energy from carbohydrates, less than 200mg  of dietary cholesterol, 20-35 gm of total fiber daily;Understanding of distribution of calorie intake throughout the day with the consumption of 4-5 meals/snacks    Tobacco Cessation Yes   quit in July   Intervention Assist the participant in steps to quit. Provide individualized education and counseling about committing to Tobacco Cessation, relapse prevention, and pharmacological support that can be provided by physician.    Expected Outcomes Long Term: Complete abstinence from all tobacco products for at least 12 months from quit date.    Improve shortness of breath with ADL's Yes    Intervention Provide education, individualized exercise plan and daily activity instruction to help decrease symptoms of SOB with activities of daily living.    Expected Outcomes Short Term: Improve cardiorespiratory fitness to achieve a reduction of symptoms when performing ADLs;Long Term: Be able to perform more ADLs without symptoms or delay the onset of symptoms    Diabetes Yes    Intervention Provide education about signs/symptoms and action to take for hypo/hyperglycemia.;Provide education about proper nutrition, including hydration, and aerobic/resistive exercise prescription along with prescribed medications to achieve blood glucose in normal ranges: Fasting glucose 65-99 mg/dL    Expected Outcomes Short Term: Participant verbalizes understanding of the signs/symptoms and immediate care of hyper/hypoglycemia, proper foot care and importance of medication, aerobic/resistive exercise and nutrition plan for blood glucose control.;Long  Term: Attainment of HbA1C < 7%.    Heart Failure Yes    Intervention Provide a combined exercise and nutrition program that is supplemented with education, support and counseling about heart failure. Directed toward relieving symptoms such as shortness of breath, decreased exercise tolerance, and extremity edema.    Expected Outcomes Improve functional capacity of life;Short term: Attendance in program 2-3 days a week with increased exercise capacity. Reported lower sodium intake. Reported increased fruit and vegetable intake. Reports medication compliance.;Short term: Daily weights obtained and reported for increase. Utilizing diuretic protocols set by physician.;Long term: Adoption of self-care skills and reduction of barriers for early signs and symptoms recognition and intervention leading to self-care maintenance.    Hypertension Yes    Intervention Provide education on lifestyle modifcations including regular physical activity/exercise, weight management, moderate sodium restriction and increased consumption of fresh fruit, vegetables, and low fat dairy, alcohol moderation, and smoking cessation.;Monitor prescription use compliance.    Expected Outcomes Short Term: Continued assessment and intervention until BP is < 140/3mm HG in hypertensive participants. < 130/61mm HG in hypertensive participants with diabetes, heart failure or chronic kidney disease.;Long Term: Maintenance of blood pressure at goal levels.    Lipids Yes    Intervention Provide education and support for participant on nutrition & aerobic/resistive exercise along with prescribed medications to achieve LDL 70mg , HDL >40mg .    Expected Outcomes Short Term: Participant states understanding of desired cholesterol values and is compliant with medications prescribed. Participant is following exercise prescription and nutrition guidelines.;Long Term: Cholesterol controlled with medications as prescribed, with individualized exercise RX  and with personalized nutrition plan. Value goals: LDL < 70mg , HDL > 40 mg.             Core Components/Risk Factors/Patient Goals Review:   Goals and Risk Factor Review     Row Name 09/21/23 1403 10/20/23 1506           Core Components/Risk Factors/Patient Goals Review   Personal Goals Review Tobacco Cessation Tobacco Cessation;Weight Management/Obesity;Hypertension;Heart Failure;Diabetes  Review Alinda Money has recently quit tobacco use within the last 6 months. Intervention for relapse prevention was provided at the initial medical review. He was encouraged to continue to with tobacco cessation and has had no cravings.  Patient was encouraged to ask staff for assistance if cravings return.  Staff will continue to provide encouragement and follow up with the patient throughout the program. Alinda Money is doing well in rehab.  He got a good report on his echo and at office last week.  He is feeling better overall.  His pressures and sugars are doing well.  He is checking them both at home on occassion.  He continues to stay away from cigarette and no longer craving them.  He is starting to lose some weight.      Expected Outcomes Continued cessation Short: Continue to work on weight loss lOng: continue to monitor risk factors.               Core Components/Risk Factors/Patient Goals at Discharge (Final Review):   Goals and Risk Factor Review - 10/20/23 1506       Core Components/Risk Factors/Patient Goals Review   Personal Goals Review Tobacco Cessation;Weight Management/Obesity;Hypertension;Heart Failure;Diabetes    Review Alinda Money is doing well in rehab.  He got a good report on his echo and at office last week.  He is feeling better overall.  His pressures and sugars are doing well.  He is checking them both at home on occassion.  He continues to stay away from cigarette and no longer craving them.  He is starting to lose some weight.    Expected Outcomes Short: Continue to work on weight  loss lOng: continue to monitor risk factors.             ITP Comments:  ITP Comments     Row Name 09/21/23 1339 09/23/23 1003 09/30/23 0708 10/28/23 0832     ITP Comments Patient attend orientation today.  Patient is attendingCardiac Rehabilitation Program.  Documentation for diagnosis can be found in [facility] encouner 06/27/23.  Reviewed medical chart, RPE/RPD, gym safety, and program guidelines.  Patient was fitted to equipment they will be using during rehab.  Patient is scheduled to start exercise on Wed 09/23/23.   Initial ITP created and sent for review and signature by Dr. Dina Rich, Medical Director for Cardiac Rehabilitation Program. First full day of exercise!  Patient was oriented to gym and equipment including functions, settings, policies, and procedures.  Patient's individual exercise prescription and treatment plan were reviewed.  All starting workloads were established based on the results of the 6 minute walk test done at initial orientation visit.  The plan for exercise progression was also introduced and progression will be customized based on patient's performance and goals. 30 day review completed. ITP sent to Dr. Dina Rich, Medical Director of Cardiac Rehab. Continue with ITP unless changes are made by physician.  Pt is new to program this round. 30 day review completed. ITP sent to Dr. Dina Rich, Medical Director of Cardiac Rehab. Continue with ITP unless changes are made by physician.             Comments: 30 day review

## 2023-10-29 ENCOUNTER — Encounter (HOSPITAL_COMMUNITY)
Admission: RE | Admit: 2023-10-29 | Discharge: 2023-10-29 | Disposition: A | Discharge status: Home / Self Care | Payer: Medicaid Other | Source: Ambulatory Visit | Attending: Cardiovascular Disease | Admitting: Cardiovascular Disease

## 2023-10-29 DIAGNOSIS — I214 Non-ST elevation (NSTEMI) myocardial infarction: Secondary | ICD-10-CM | POA: Insufficient documentation

## 2023-10-29 DIAGNOSIS — Z955 Presence of coronary angioplasty implant and graft: Secondary | ICD-10-CM | POA: Insufficient documentation

## 2023-10-29 LAB — GLUCOSE, CAPILLARY: Glucose-Capillary: 88 mg/dL (ref 70–99)

## 2023-10-29 NOTE — Progress Notes (Signed)
Daily Session Note  Patient Details  Name: Seth [name] MRN: [id number] Date of Birth: 12-07-1961 Referring Provider:   Flowsheet Row CARDIAC REHAB PHASE II ORIENTATION from 09/21/2023 in [facility] CARDIAC REHABILITATION  Referring Provider Nanetta Batty MD       Encounter Date: 10/29/2023  Check In:  Session Check In - 10/29/23 1437       Check-In   Supervising physician immediately available to respond to emergencies See telemetry face sheet for immediately available MD    Location [facility]    Staff Present Ross Ludwig, BS, Exercise Physiologist;Jessica Juanetta Gosling, MA, RCEP, CCRP, Dow Adolph, RN, BSN;Other    Virtual Visit No    Medication changes reported     No    Fall or balance concerns reported    No    Tobacco Cessation No Change    Warm-up and Cool-down Performed on first and last piece of equipment    Resistance Training Performed Yes    VAD Patient? No    PAD/SET Patient? No      Pain Assessment   Currently in Pain? No/denies    Multiple Pain Sites No             Capillary Blood Glucose: No results found for this or any previous visit (from the past 24 hour(s)).    Social History   Tobacco Use  Smoking Status Former   Current packs/day: 1.00   Average packs/day: 1 pack/day for 43.8 years (43.8 ttl pk-yrs)   Types: Cigarettes   Start date: 21  Smokeless Tobacco Former  Tobacco Comments   Quit date 06/27/2023    Goals Met:  Independence with exercise equipment Exercise tolerated well No report of concerns or symptoms today Strength training completed today  Goals Unmet:  Not Applicable  Comments: Pt able to follow exercise prescription today without complaint.  Will continue to monitor for progression.

## 2023-10-30 ENCOUNTER — Encounter (HOSPITAL_COMMUNITY): Payer: Medicaid Other

## 2023-11-02 ENCOUNTER — Encounter (HOSPITAL_COMMUNITY): Payer: Medicaid Other

## 2023-11-03 ENCOUNTER — Encounter (HOSPITAL_COMMUNITY)
Admission: RE | Admit: 2023-11-03 | Discharge: 2023-11-03 | Disposition: A | Discharge status: Home / Self Care | Payer: Medicaid Other | Source: Ambulatory Visit | Attending: Cardiovascular Disease

## 2023-11-03 DIAGNOSIS — I214 Non-ST elevation (NSTEMI) myocardial infarction: Secondary | ICD-10-CM

## 2023-11-03 DIAGNOSIS — Z955 Presence of coronary angioplasty implant and graft: Secondary | ICD-10-CM

## 2023-11-03 NOTE — Progress Notes (Signed)
Daily Session Note  Patient Details  Name: Seth [name] MRN: [id number] Date of Birth: 11/02/1961 Referring Provider:   Flowsheet Row CARDIAC REHAB PHASE II ORIENTATION from 09/21/2023 in [facility] CARDIAC REHABILITATION  Referring Provider Nanetta Batty MD       Encounter Date: 11/03/2023  Check In:  Session Check In - 11/03/23 1503       Check-In   Supervising physician immediately available to respond to emergencies See telemetry face sheet for immediately available MD    Location [facility]-Cardiac & Pulmonary [facility]    Staff Present Erskine Speed, RN;Mack Thurmon [place], MA, RCEP, CCRP, CCET    Staff Present Hulen Luster, BS, RRT, CPFT    Virtual Visit No    Medication changes reported     No    Fall or balance concerns reported    No    Warm-up and Cool-down Performed on first and last piece of equipment    Resistance Training Performed Yes    VAD Patient? No    PAD/SET Patient? No      Pain Assessment   Currently in Pain? No/denies             Capillary Blood Glucose: No results found for this or any previous visit (from the past 24 hour(s)).    Social History   Tobacco Use  Smoking Status Former   Current packs/day: 1.00   Average packs/day: 1 pack/day for 43.9 years (43.9 ttl pk-yrs)   Types: Cigarettes   Start date: 71  Smokeless Tobacco Former  Tobacco Comments   Quit date 06/27/2023    Goals Met:  Proper associated with RPD/PD & O2 Sat Independence with exercise equipment Using PLB without cueing & demonstrates good technique Exercise tolerated well No report of concerns or symptoms today Strength training completed today  Goals Unmet:  Not Applicable  Comments: Pt able to follow exercise prescription today without complaint.  Will continue to monitor for progression.

## 2023-11-04 ENCOUNTER — Ambulatory Visit (HOSPITAL_COMMUNITY): Payer: Medicaid Other

## 2023-11-05 ENCOUNTER — Encounter (HOSPITAL_COMMUNITY): Payer: Medicaid Other

## 2023-11-06 ENCOUNTER — Encounter (HOSPITAL_COMMUNITY): Payer: Medicaid Other

## 2023-11-09 ENCOUNTER — Encounter (HOSPITAL_COMMUNITY): Payer: Medicaid Other

## 2023-11-10 ENCOUNTER — Encounter (HOSPITAL_COMMUNITY): Payer: Medicaid Other

## 2023-11-11 ENCOUNTER — Encounter (HOSPITAL_COMMUNITY): Payer: Medicaid Other

## 2023-11-12 ENCOUNTER — Encounter (HOSPITAL_COMMUNITY): Payer: Medicaid Other

## 2023-11-13 ENCOUNTER — Encounter (HOSPITAL_COMMUNITY): Payer: Medicaid Other

## 2023-11-16 ENCOUNTER — Ambulatory Visit (HOSPITAL_COMMUNITY): Payer: Medicaid Other

## 2023-11-17 ENCOUNTER — Encounter (HOSPITAL_COMMUNITY): Payer: Medicaid Other

## 2023-11-18 ENCOUNTER — Ambulatory Visit (HOSPITAL_COMMUNITY): Payer: Medicaid Other

## 2023-11-23 ENCOUNTER — Encounter (HOSPITAL_COMMUNITY): Payer: Medicaid Other

## 2023-11-23 ENCOUNTER — Telehealth (HOSPITAL_COMMUNITY): Payer: Self-pay

## 2023-11-23 NOTE — Telephone Encounter (Signed)
Called patient to follow up since he has not been to rehab in over a week. He did no answer and could not leave message due to voicemail not set up. Will follow up.

## 2023-11-24 ENCOUNTER — Encounter (HOSPITAL_COMMUNITY): Payer: Medicaid Other | Attending: Cardiovascular Disease

## 2023-11-24 DIAGNOSIS — I214 Non-ST elevation (NSTEMI) myocardial infarction: Secondary | ICD-10-CM | POA: Insufficient documentation

## 2023-11-24 DIAGNOSIS — Z955 Presence of coronary angioplasty implant and graft: Secondary | ICD-10-CM | POA: Insufficient documentation

## 2023-11-25 ENCOUNTER — Ambulatory Visit (HOSPITAL_COMMUNITY): Payer: Medicaid Other

## 2023-11-25 ENCOUNTER — Encounter (HOSPITAL_COMMUNITY): Payer: Self-pay | Admitting: *Deleted

## 2023-11-25 DIAGNOSIS — Z955 Presence of coronary angioplasty implant and graft: Secondary | ICD-10-CM

## 2023-11-25 DIAGNOSIS — I214 Non-ST elevation (NSTEMI) myocardial infarction: Secondary | ICD-10-CM

## 2023-11-25 NOTE — Progress Notes (Signed)
Cardiac Individual Treatment Plan  Patient Details  Name: Seth [name] MRN: [id number] Date of Birth: 05/03/1961 Referring Provider:   Flowsheet Row CARDIAC REHAB PHASE II ORIENTATION from 09/21/2023 in [facility] CARDIAC REHABILITATION  Referring Provider Nanetta Batty MD       Initial Encounter Date:  Flowsheet Row CARDIAC REHAB PHASE II ORIENTATION from 09/21/2023 in [place] CARDIAC REHABILITATION  Date 09/21/23       Visit Diagnosis: NSTEMI (non-ST elevated myocardial infarction) [facility])  Status post coronary artery stent placement  Patient's Home Medications on Admission:  Current Outpatient Medications:    aspirin EC 81 MG tablet, Take 1 tablet (81 mg total) by mouth daily. Swallow whole., Disp: 30 tablet, Rfl: 2   atorvastatin (LIPITOR) 80 MG tablet, Take 1 tablet (80 mg total) by mouth daily., Disp: 90 tablet, Rfl: 1   carvedilol (COREG) 6.25 MG tablet, Take 1 tablet (6.25 mg total) by mouth 2 (two) times daily with a meal. (Patient not taking: Reported on 09/16/2023), Disp: 180 tablet, Rfl: 1   clopidogrel (PLAVIX) 75 MG tablet, Take 1 tablet (75 mg total) by mouth daily., Disp: 90 tablet, Rfl: 2   empagliflozin (JARDIANCE) 10 MG TABS tablet, Take 1 tablet (10 mg total) by mouth daily., Disp: 30 tablet, Rfl: 3   ibuprofen (ADVIL) 200 MG tablet, Take 200 mg by mouth every 6 (six) hours as needed for headache or mild pain., Disp: , Rfl:    losartan (COZAAR) 25 MG tablet, Take 1 tablet (25 mg total) by mouth daily., Disp: 90 tablet, Rfl: 1   Multiple Vitamin (MULTIVITAMIN WITH MINERALS) TABS tablet, Take 1 tablet by mouth daily., Disp: , Rfl:    nitroGLYCERIN (NITROSTAT) 0.4 MG SL tablet, Place 1 tablet (0.4 mg total) under the tongue every 5 (five) minutes x 3 doses as needed for chest pain., Disp: 25 tablet, Rfl: 2   Nutritional Supplements (PROSTATE PO), Take 1 tablet by mouth daily. (Patient not taking: Reported on 09/16/2023), Disp: , Rfl:    tamsulosin (FLOMAX) 0.4  MG CAPS capsule, Take 0.4 mg by mouth daily., Disp: , Rfl:   Past Medical History: Past Medical History:  Diagnosis Date   BPH (benign prostatic hyperplasia)    Hyperlipidemia    Hypertension    NSTEMI (non-ST elevated myocardial infarction) (HCC)    PONV (postoperative nausea and vomiting)    Type 2 diabetes mellitus (HCC)     Tobacco Use: Social History   Tobacco Use  Smoking Status Former   Current packs/day: 1.00   Average packs/day: 1 pack/day for 43.9 years (43.9 ttl pk-yrs)   Types: Cigarettes   Start date: 34  Smokeless Tobacco Former  Tobacco Comments   Quit date 06/27/2023    Labs: Review Flowsheet  More data exists      Latest Ref Rng & Units 02/13/2010 03/08/2010 06/27/2023 06/28/2023 06/29/2023  Labs for ITP Cardiac and Pulmonary Rehab  Cholestrol 0 - 200 mg/dL 010        ATP III CLASSIFICATION:  <200     mg/dL   Desirable  272-536  mg/dL   Borderline High  >=644    mg/dL   High         - - 034  98   LDL (calc) 0 - 99 mg/dL 742        Total Cholesterol/HDL:CHD Risk Coronary Heart Disease Risk Table  Men   Women  1/2 Average Risk   3.4   3.3  Average Risk       5.0   4.4  2 X Average Risk   9.6   7.1  3 X Average Risk  23.4   11.0        Use the calculated Patient Ratio above and the CHD Risk Table to determine the patient's CHD Risk.        ATP III CLASSIFICATION (LDL):  <100     mg/dL   Optimal  161-096  mg/dL   Near or Above                    Optimal  130-159  mg/dL   Borderline  045-409  mg/dL   High  >811     mg/dL   Very High  - - 72  65   HDL-C >40 mg/dL 35  - - 33  14   Trlycerides <150 mg/dL 914  - - 782  95   Hemoglobin A1c 4.8 - 5.6 % 5.9 (NOTE) The ADA recommends the following therapeutic goal for glycemic control related to Hgb A1c measurement: Goal of therapy: <6.5 Hgb A1c  Reference: American Diabetes Association: Clinical Practice Recommendations 2010, Diabetes Care, 2010, 33: (Suppl  1).  5.9  6.8  - -     Details            Capillary Blood Glucose: Lab Results  Component Value Date   GLUCAP 88 10/29/2023   GLUCAP 127 (H) 10/02/2023   GLUCAP 134 (H) 09/28/2023   GLUCAP 148 (H) 09/28/2023   GLUCAP 97 09/23/2023     Exercise Target Goals: Exercise Program Goal: Individual exercise prescription set using results from initial 6 min walk test and THRR while considering  patient's activity barriers and safety.   Exercise Prescription Goal: Starting with aerobic activity 30 plus minutes a day, 3 days per week for initial exercise prescription. Provide home exercise prescription and guidelines that participant acknowledges understanding prior to discharge.  Activity Barriers & Risk Stratification:  Activity Barriers & Cardiac Risk Stratification - 09/21/23 1348       Activity Barriers & Cardiac Risk Stratification   Activity Barriers Shortness of Breath;Arthritis;Joint Problems;Balance Concerns   chronic bad knees, hips, shoulders from construction work   Cardiac Risk Stratification Moderate             6 Minute Walk:  6 Minute Walk     Row Name 09/21/23 1346         6 Minute Walk   Phase Initial     Distance 1637 feet     Walk Time 6 minutes     # of Rest Breaks 0     MPH 3.1     METS 3.97     RPE 9     VO2 Peak 13.89     Symptoms No     Resting HR 65 bpm     Resting BP 108/66     Resting Oxygen Saturation  94 %     Exercise Oxygen Saturation  during 6 min walk 95 %     Max Ex. HR 102 bpm     Max Ex. BP 128/74     2 Minute Post BP 112/66              Oxygen Initial Assessment:   Oxygen Re-Evaluation:   Oxygen Discharge (Final Oxygen Re-Evaluation):   Initial Exercise Prescription:  Initial  Exercise Prescription - 09/21/23 1300       Date of Initial Exercise RX and Referring Provider   Date 09/21/23    Referring Provider Nanetta Batty MD      Oxygen   Maintain Oxygen Saturation 88% or higher      Treadmill   MPH 3.1    Grade 1     Minutes 15    METs 3.8      REL-XR   Level 3    Speed 50    Minutes 15    METs 3.5      Prescription Details   Frequency (times per week) 3    Duration Progress to 30 minutes of continuous aerobic without signs/symptoms of physical distress      Intensity   THRR 40-80% of Max Heartrate 103-140    Ratings of Perceived Exertion 11-13    Perceived Dyspnea 0-4      Progression   Progression Continue to progress workloads to maintain intensity without signs/symptoms of physical distress.      Resistance Training   Training Prescription Yes    Weight 5 lb    Reps 10-15             Perform Capillary Blood Glucose checks as needed.  Exercise Prescription Changes:   Exercise Prescription Changes     Row Name 09/21/23 1300 10/22/23 1500 11/03/23 1200         Response to Exercise   Blood Pressure (Admit) 108/66 126/72 126/64     Blood Pressure (Exercise) 128/74 146/72 --     Blood Pressure (Exit) 112/66 126/72 126/72     Heart Rate (Admit) 65 bpm 73 bpm 70 bpm     Heart Rate (Exercise) 102 bpm 118 bpm 115 bpm     Heart Rate (Exit) 67 bpm 75 bpm 83 bpm     Oxygen Saturation (Admit) 94 % -- --     Oxygen Saturation (Exercise) 95 % -- --     Rating of Perceived Exertion (Exercise) 9 13 13      Symptoms none -- --     Comments walk test results -- --     Duration -- Continue with 30 min of aerobic exercise without signs/symptoms of physical distress. Continue with 30 min of aerobic exercise without signs/symptoms of physical distress.     Intensity -- THRR unchanged THRR unchanged       Progression   Progression -- Continue to progress workloads to maintain intensity without signs/symptoms of physical distress. Continue to progress workloads to maintain intensity without signs/symptoms of physical distress.       Resistance Training   Training Prescription -- Yes Yes     Weight -- 5 lbs / blue band 5 lbs     Reps -- 10-15 10-15       Treadmill   MPH -- 3.5 3      Grade -- 2 3     Minutes -- 15 15     METs -- 4.65 4.54       NuStep   Level -- 5 5     SPM -- 60 60     Minutes -- 15 15     METs -- 2.8 3.6              Exercise Comments:   Exercise Comments     Row Name 09/23/23 1003           Exercise Comments First full day of exercise!  Patient  was oriented to gym and equipment including functions, settings, policies, and procedures.  Patient's individual exercise prescription and treatment plan were reviewed.  All starting workloads were established based on the results of the 6 minute walk test done at initial orientation visit.  The plan for exercise progression was also introduced and progression will be customized based on patient's performance and goals.                Exercise Goals and Review:   Exercise Goals     Row Name 09/21/23 1357             Exercise Goals   Increase Physical Activity Yes       Intervention Develop an individualized exercise prescription for aerobic and resistive training based on initial evaluation findings, risk stratification, comorbidities and participant's personal goals.;Provide advice, education, support and counseling about physical activity/exercise needs.       Expected Outcomes Short Term: Attend rehab on a regular basis to increase amount of physical activity.;Long Term: Add in home exercise to make exercise part of routine and to increase amount of physical activity.;Long Term: Exercising regularly at least 3-5 days a week.       Increase Strength and Stamina Yes       Intervention Provide advice, education, support and counseling about physical activity/exercise needs.;Develop an individualized exercise prescription for aerobic and resistive training based on initial evaluation findings, risk stratification, comorbidities and participant's personal goals.       Expected Outcomes Short Term: Increase workloads from initial exercise prescription for resistance, speed, and METs.;Long  Term: Improve cardiorespiratory fitness, muscular endurance and strength as measured by increased METs and functional capacity ( );Short Term: Perform resistance training exercises routinely during rehab and add in resistance training at home       Able to understand and use rate of perceived exertion (RPE) scale Yes       Intervention Provide education and explanation on how to use RPE scale       Expected Outcomes Short Term: Able to use RPE daily in rehab to express subjective intensity level;Long Term:  Able to use RPE to guide intensity level when exercising independently       Able to understand and use Dyspnea scale Yes       Intervention Provide education and explanation on how to use Dyspnea scale       Expected Outcomes Short Term: Able to use Dyspnea scale daily in rehab to express subjective sense of shortness of breath during exertion;Long Term: Able to use Dyspnea scale to guide intensity level when exercising independently       Knowledge and understanding of Target Heart Rate Range (THRR) Yes       Intervention Provide education and explanation of THRR including how the numbers were predicted and where they are located for reference       Expected Outcomes Short Term: Able to state/look up THRR;Long Term: Able to use THRR to govern intensity when exercising independently;Short Term: Able to use daily as guideline for intensity in rehab       Able to check pulse independently Yes       Intervention Review the importance of being able to check your own pulse for safety during independent exercise;Provide education and demonstration on how to check pulse in carotid and radial arteries.       Expected Outcomes Short Term: Able to explain why pulse checking is important during independent exercise;Long Term: Able to check pulse independently  and accurately       Understanding of Exercise Prescription Yes       Intervention Provide education, explanation, and written materials on patient's  individual exercise prescription       Expected Outcomes Short Term: Able to explain program exercise prescription;Long Term: Able to explain home exercise prescription to exercise independently                Exercise Goals Re-Evaluation :  Exercise Goals Re-Evaluation     Row Name 09/23/23 1003 10/20/23 1458 11/03/23 1523         Exercise Goal Re-Evaluation   Exercise Goals Review Able to understand and use rate of perceived exertion (RPE) scale;Able to understand and use Dyspnea scale;Knowledge and understanding of Target Heart Rate Range (THRR);Understanding of Exercise Prescription Increase Physical Activity;Increase Strength and Stamina;Understanding of Exercise Prescription Increase Physical Activity;Increase Strength and Stamina;Understanding of Exercise Prescription     Comments Reviewed RPE and dyspnea scale, THR and program prescription with pt today.  Pt voiced understanding and was given a copy of goals to take home. Seth [name] is doing well in rehab. He has missed the last couple of weeks due to work conflicts.  He has now switched classes to be more accomodating to his schedule.  He has missed coming to class and not doing as much at home.  He is surprised that he is still going and not too tired in class. Seth [name] is doing well in rehab.  He is walking some at home and staying active at work.  He is not doing any formal exercise on his off days. We talked about importance of keeping up exercise at home.  His stamina has been improving.     Expected Outcomes Short: Use RPE daily to regulate intensity.  Long: Follow program prescription in THR. Short; Return to consistent attendance Long: Continue to improve stamina Short: Add in more exercise on off days Long; Continue to exercise independently               Discharge Exercise Prescription (Final Exercise Prescription Changes):  Exercise Prescription Changes - 11/03/23 1200       Response to Exercise   Blood Pressure (Admit)  126/64    Blood Pressure (Exit) 126/72    Heart Rate (Admit) 70 bpm    Heart Rate (Exercise) 115 bpm    Heart Rate (Exit) 83 bpm    Rating of Perceived Exertion (Exercise) 13    Duration Continue with 30 min of aerobic exercise without signs/symptoms of physical distress.    Intensity THRR unchanged      Progression   Progression Continue to progress workloads to maintain intensity without signs/symptoms of physical distress.      Resistance Training   Training Prescription Yes    Weight 5 lbs    Reps 10-15      Treadmill   MPH 3    Grade 3    Minutes 15    METs 4.54      NuStep   Level 5    SPM 60    Minutes 15    METs 3.6             Nutrition:  Target Goals: Understanding of nutrition guidelines, daily intake of sodium 1500mg , cholesterol 200mg , calories 30% from fat and 7% or less from saturated fats, daily to have 5 or more servings of fruits and vegetables.  Biometrics:  Pre Biometrics - 09/21/23 1358       Pre Biometrics  Height 6\' 2"  (1.88 m)    Weight 227 lb 1.6 oz (103 kg)    Waist Circumference 40 inches    Hip Circumference 41.5 inches    Waist to Hip Ratio 0.96 %    BMI (Calculated) 29.15    Grip Strength 37.8 kg    Single Leg Stand 4.2 seconds              Nutrition Therapy Plan and Nutrition Goals:  Nutrition Therapy & Goals - 09/21/23 1359       Intervention Plan   Intervention Prescribe, educate and counsel regarding individualized specific dietary modifications aiming towards targeted core components such as weight, hypertension, lipid management, diabetes, heart failure and other comorbidities.    Expected Outcomes Short Term Goal: Understand basic principles of dietary content, such as calories, fat, sodium, cholesterol and nutrients.;Long Term Goal: Adherence to prescribed nutrition plan.             Nutrition Assessments:  MEDIFICTS Score Key: >=70 Need to make dietary changes  40-70 Heart Healthy Diet <= 40  Therapeutic Level Cholesterol Diet   Picture Your Plate Scores: <54 Unhealthy dietary pattern with much room for improvement. 41-50 Dietary pattern unlikely to meet recommendations for good health and room for improvement. 51-60 More healthful dietary pattern, with some room for improvement.  >60 Healthy dietary pattern, although there may be some specific behaviors that could be improved.    Nutrition Goals Re-Evaluation:  Nutrition Goals Re-Evaluation     Row Name 10/20/23 1502 11/03/23 1528           Goals   Nutrition Goal Heart Healthy Diet short: Continue to cut back on salt and sugar Long: Conitnue to try lean versions of protein      Comment Seth [name] is doing well in rehab.  He is trying to eat well for most part.  But he tries to practice moderation when he eats something that is not as good for him.  He is eating more oatmeal now.  He is trying to limit his salt and sugar.  He is getting in lots of peanut butter and beans for protein source in addition to meat.  We talked about limiting his fatty meats.  He will continue to try to eat well. Seth [name] is doing well with his diet.  He is watching his saturated fats and getting lean proteins.  He wants to use his air fryer.  He is still watching his sodium and sugars, but they have greatly improved.  He is still doing good with fruits and vegetables.  He has been cooking at home and getting low sodium soups too.      Expected Outcome short: Continue to cut back on salt and sugar Long: Conitnue to try lean versions of protein Short: Continue to improve diet Long: Conitnue to eat lots of fruits and vegetables               Nutrition Goals Discharge (Final Nutrition Goals Re-Evaluation):  Nutrition Goals Re-Evaluation - 11/03/23 1528       Goals   Nutrition Goal short: Continue to cut back on salt and sugar Long: Conitnue to try lean versions of protein    Comment Seth [name] is doing well with his diet.  He is watching his saturated fats and  getting lean proteins.  He wants to use his air fryer.  He is still watching his sodium and sugars, but they have greatly improved.  He is still doing good with fruits  and vegetables.  He has been cooking at home and getting low sodium soups too.    Expected Outcome Short: Continue to improve diet Long: Conitnue to eat lots of fruits and vegetables             Psychosocial: Target Goals: Acknowledge presence or absence of significant depression and/or stress, maximize coping skills, provide positive support system. Participant is able to verbalize types and ability to use techniques and skills needed for reducing stress and depression.  Initial Review & Psychosocial Screening:  Initial Psych Review & Screening - 09/16/23 1040       Initial Review   Current issues with None Identified      Family Dynamics   Good Support System? Yes      Barriers   Psychosocial barriers to participate in program There are no identifiable barriers or psychosocial needs.;The patient should benefit from training in stress management and relaxation.      Screening Interventions   Interventions To provide support and resources with identified psychosocial needs;Provide feedback about the scores to participant;Encouraged to exercise    Expected Outcomes Short Term goal: Utilizing psychosocial counselor, staff and physician to assist with identification of specific Stressors or current issues interfering with healing process. Setting desired goal for each stressor or current issue identified.;Long Term Goal: Stressors or current issues are controlled or eliminated.;Short Term goal: Identification and review with participant of any Quality of Life or Depression concerns found by scoring the questionnaire.;Long Term goal: The participant improves quality of Life and PHQ9 Scores as seen by post scores and/or verbalization of changes             Quality of Life Scores:  Quality of Life - 09/21/23 1359        Quality of Life   Select Quality of Life      Quality of Life Scores   Health/Function Pre 24.46 %    Socioeconomic Pre 26.81 %    Psych/Spiritual Pre 24.29 %    Family Pre 26 %    GLOBAL Pre 25.25 %            Scores of 19 and below usually indicate a poorer quality of life in these areas.  A difference of  2-3 points is a clinically meaningful difference.  A difference of 2-3 points in the total score of the Quality of Life Index has been associated with significant improvement in overall quality of life, self-image, physical symptoms, and general health in studies assessing change in quality of life.  PHQ-9: Review Flowsheet       09/21/2023 08/20/2023  Depression screen PHQ 2/9  Decreased Interest 0 0  Down, Depressed, Hopeless 0 0  PHQ - 2 Score 0 0  Altered sleeping 0 0  Tired, decreased energy 0 0  Change in appetite 0 0  Feeling bad or failure about yourself  0 0  Trouble concentrating 0 0  Moving slowly or fidgety/restless 0 0  Suicidal thoughts 0 0  PHQ-9 Score 0 0  Difficult doing work/chores Not difficult at all Not difficult at all    Details           Interpretation of Total Score  Total Score Depression Severity:  1-4 = Minimal depression, 5-9 = Mild depression, 10-14 = Moderate depression, 15-19 = Moderately severe depression, 20-27 = Severe depression   Psychosocial Evaluation and Intervention:  Psychosocial Evaluation - 09/16/23 1042       Psychosocial Evaluation & Interventions  Interventions Stress management education;Encouraged to exercise with the program and follow exercise prescription    Comments Patient was referred to Cr with NSTEMI and stent placement 06/27/23. His CR was delayed due to not having health insurance. He denies any depression or anxiety. He has been out of work since his event and is ready to go back. He has been released to start back Tuesday 10/1. He works at a [employer] in [place]. He says he is very active  on his job. He denies any stressors in his life. He says his mother passed away in 06-04-2023. He lives alone but has good support from his sister and some close friends. His main goals for the program are to learn what he is able to do; make sure his heart is working okay and to get back to his normal life. He says he sleeps well at night. 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 He is not sure how much of the program he wants to do due to having to miss work but has committed to at least 21 sessions and will "see how it goes".  His work schedule could be a possible barriers for him to complete 36 sessions.    Expected Outcomes Short Term: start the program and attend consistenly. Long Term: Meet his personal goals.    Continue Psychosocial Services  Follow up required by staff             Psychosocial Re-Evaluation:  Psychosocial Re-Evaluation     Row Name 10/20/23 1501 11/03/23 1525           Psychosocial Re-Evaluation   Current issues with Current Stress Concerns Current Stress Concerns      Comments Seth [name] returned to rehab today after being out with work conflicts.  He has switched classes to be accomodate his schedule. He got a good report from doctor and echo last week.  He is sleeping well.  He is generally positive and enjoys life and his job. Seth [name] is doing well in rehab.  Work continues to be his biggest stressor.  He lives on his own and just looks after his dogs, cats, and himself.  He is feeling good and staying positive.      Expected Outcomes short: Conitnue to come to rehab to exercise Long: conitnue to exercise for mental boost Short: Conitnue to exercies for mental boost Long; Conitnue to stay positive      Interventions Encouraged to attend Cardiac Rehabilitation for the exercise Encouraged to attend Cardiac Rehabilitation for the exercise      Continue Psychosocial Services  Follow up required by staff Follow up required by  staff               Psychosocial Discharge (Final Psychosocial Re-Evaluation):  Psychosocial Re-Evaluation - 11/03/23 1525       Psychosocial Re-Evaluation   Current issues with Current Stress Concerns    Comments Seth [name] is doing well in rehab.  Work continues to be his biggest stressor.  He lives on his own and just looks after his dogs, cats, and himself.  He is feeling good and staying positive.    Expected Outcomes Short: Conitnue to exercies for mental boost Long; Conitnue to stay positive    Interventions Encouraged to attend Cardiac Rehabilitation for the exercise    Continue Psychosocial Services  Follow up required by staff  Vocational Rehabilitation: Provide vocational rehab assistance to qualifying candidates.   Vocational Rehab Evaluation & Intervention:  Vocational Rehab - 09/16/23 1041       Initial Vocational Rehab Evaluation & Intervention   Assessment shows need for Vocational Rehabilitation No      Vocational Rehab Re-Evaulation   Comments Patient is returning to work next week at a [employer].             Education: Education Goals: Education classes will be provided on a weekly basis, covering required topics. Participant will state understanding/return demonstration of topics presented.  Learning Barriers/Preferences:  Learning Barriers/Preferences - 09/16/23 1040       Learning Barriers/Preferences   Learning Barriers None    Learning Preferences Skilled Demonstration             Education Topics: Hypertension, Hypertension Reduction -Define heart disease and high blood pressure. Discus how high blood pressure affects the body and ways to reduce high blood pressure.   Exercise and Your Heart -Discuss why it is important to exercise, the FITT principles of exercise, normal and abnormal responses to exercise, and how to exercise safely. Flowsheet Row CARDIAC REHAB PHASE II EXERCISE from 10/29/2023 in [place]  CARDIAC REHABILITATION  Date 09/30/23  Alberteen Sam 2]  Educator [facility]  Instruction Review Code 1- Verbalizes Understanding       Angina -Discuss definition of angina, causes of angina, treatment of angina, and how to decrease risk of having angina.   Cardiac Medications -Review what the following cardiac medications are used for, how they affect the body, and side effects that may occur when taking the medications.  Medications include Aspirin, Beta blockers, calcium channel blockers, ACE Inhibitors, angiotensin receptor blockers, diuretics, digoxin, and antihyperlipidemics. Flowsheet Row CARDIAC REHAB PHASE II EXERCISE from 10/29/2023 in [place] CARDIAC REHABILITATION  Date 10/29/23  Educator hb  Instruction Review Code 1- Verbalizes Understanding       Congestive Heart Failure -Discuss the definition of CHF, how to live with CHF, the signs and symptoms of CHF, and how keep track of weight and sodium intake. Flowsheet Row CARDIAC REHAB PHASE II EXERCISE from 10/29/2023 in [place] CARDIAC REHABILITATION  Date 10/22/23  Educator jh  Instruction Review Code 1- Verbalizes Understanding       Heart Disease and Intimacy -Discus the effect sexual activity has on the heart, how changes occur during intimacy as we age, and safety during sexual activity. Flowsheet Row CARDIAC REHAB PHASE II EXERCISE from 10/29/2023 in [place] CARDIAC REHABILITATION  Date 09/23/23  Educator HB  Instruction Review Code 1- Verbalizes Understanding       Smoking Cessation / COPD -Discuss different methods to quit smoking, the health benefits of quitting smoking, and the definition of COPD.   Nutrition I: Fats -Discuss the types of cholesterol, what cholesterol does to the heart, and how cholesterol levels can be controlled.   Nutrition II: Labels -Discuss the different components of food labels and how to read food label   Heart Parts/Heart Disease and PAD -Discuss the anatomy of the heart, the  pathway of blood circulation through the heart, and these are affected by heart disease.   Stress I: Signs and Symptoms -Discuss the causes of stress, how stress may lead to anxiety and depression, and ways to limit stress.   Stress II: Relaxation -Discuss different types of relaxation techniques to limit stress.   Warning Signs of Stroke / TIA -Discuss definition of a stroke, what the signs and symptoms  are of a stroke, and how to identify when someone is having stroke.   Knowledge Questionnaire Score:  Knowledge Questionnaire Score - 09/21/23 1359       Knowledge Questionnaire Score   Pre Score 20/24             Core Components/Risk Factors/Patient Goals at Admission:  Personal Goals and Risk Factors at Admission - 09/21/23 1359       Core Components/Risk Factors/Patient Goals on Admission    Weight Management Weight Maintenance;Yes;Weight Loss    Intervention Weight Management: Develop a combined nutrition and exercise program designed to reach desired caloric intake, while maintaining appropriate intake of nutrient and fiber, sodium and fats, and appropriate energy expenditure required for the weight goal.;Weight Management: Provide education and appropriate resources to help participant work on and attain dietary goals.    Admit Weight 227 lb 1.6 oz (103 kg)    Goal Weight: Short Term 220 lb (99.8 kg)    Goal Weight: Long Term 215 lb (97.5 kg)    Expected Outcomes Short Term: Continue to assess and modify interventions until short term weight is achieved;Long Term: Adherence to nutrition and physical activity/exercise program aimed toward attainment of established weight goal;Weight Loss: Understanding of general recommendations for a balanced deficit meal plan, which promotes 1-2 lb weight loss per week and includes a negative energy balance of [phone] kcal/d;Understanding recommendations for meals to include 15-35% energy as protein, 25-35% energy from fat, 35-60% energy  from carbohydrates, less than 200mg  of dietary cholesterol, 20-35 gm of total fiber daily;Understanding of distribution of calorie intake throughout the day with the consumption of 4-5 meals/snacks    Tobacco Cessation Yes   quit in July   Intervention Assist the participant in steps to quit. Provide individualized education and counseling about committing to Tobacco Cessation, relapse prevention, and pharmacological support that can be provided by physician.    Expected Outcomes Long Term: Complete abstinence from all tobacco products for at least 12 months from quit date.    Improve shortness of breath with ADL's Yes    Intervention Provide education, individualized exercise plan and daily activity instruction to help decrease symptoms of SOB with activities of daily living.    Expected Outcomes Short Term: Improve cardiorespiratory fitness to achieve a reduction of symptoms when performing ADLs;Long Term: Be able to perform more ADLs without symptoms or delay the onset of symptoms    Diabetes Yes    Intervention Provide education about signs/symptoms and action to take for hypo/hyperglycemia.;Provide education about proper nutrition, including hydration, and aerobic/resistive exercise prescription along with prescribed medications to achieve blood glucose in normal ranges: Fasting glucose 65-99 mg/dL    Expected Outcomes Short Term: Participant verbalizes understanding of the signs/symptoms and immediate care of hyper/hypoglycemia, proper foot care and importance of medication, aerobic/resistive exercise and nutrition plan for blood glucose control.;Long Term: Attainment of HbA1C < 7%.    Heart Failure Yes    Intervention Provide a combined exercise and nutrition program that is supplemented with education, support and counseling about heart failure. Directed toward relieving symptoms such as shortness of breath, decreased exercise tolerance, and extremity edema.    Expected Outcomes Improve  functional capacity of life;Short term: Attendance in program 2-3 days a week with increased exercise capacity. Reported lower sodium intake. Reported increased fruit and vegetable intake. Reports medication compliance.;Short term: Daily weights obtained and reported for increase. Utilizing diuretic protocols set by physician.;Long term: Adoption of self-care skills and reduction of barriers for early  signs and symptoms recognition and intervention leading to self-care maintenance.    Hypertension Yes    Intervention Provide education on lifestyle modifcations including regular physical activity/exercise, weight management, moderate sodium restriction and increased consumption of fresh fruit, vegetables, and low fat dairy, alcohol moderation, and smoking cessation.;Monitor prescription use compliance.    Expected Outcomes Short Term: Continued assessment and intervention until BP is < 140/65mm HG in hypertensive participants. < 130/24mm HG in hypertensive participants with diabetes, heart failure or chronic kidney disease.;Long Term: Maintenance of blood pressure at goal levels.    Lipids Yes    Intervention Provide education and support for participant on nutrition & aerobic/resistive exercise along with prescribed medications to achieve LDL 70mg , HDL >40mg .    Expected Outcomes Short Term: Participant states understanding of desired cholesterol values and is compliant with medications prescribed. Participant is following exercise prescription and nutrition guidelines.;Long Term: Cholesterol controlled with medications as prescribed, with individualized exercise RX and with personalized nutrition plan. Value goals: LDL < 70mg , HDL > 40 mg.             Core Components/Risk Factors/Patient Goals Review:   Goals and Risk Factor Review     Row Name 09/21/23 1403 10/20/23 1506 11/03/23 1526         Core Components/Risk Factors/Patient Goals Review   Personal Goals Review Tobacco Cessation Tobacco  Cessation;Weight Management/Obesity;Hypertension;Heart Failure;Diabetes Tobacco Cessation;Weight Management/Obesity;Hypertension;Heart Failure;Diabetes     Review Seth [name] has recently quit tobacco use within the last 6 months. Intervention for relapse prevention was provided at the initial medical review. He was encouraged to continue to with tobacco cessation and has had no cravings.  Patient was encouraged to ask staff for assistance if cravings return.  Staff will continue to provide encouragement and follow up with the patient throughout the program. Seth [name] is doing well in rehab.  He got a good report on his echo and at office last week.  He is feeling better overall.  His pressures and sugars are doing well.  He is checking them both at home on occassion.  He continues to stay away from cigarette and no longer craving them.  He is starting to lose some weight. Seth [name] is doing well in rehab. He has not had his jaurdiance for a month and his sugars are doing well.  He is waiting to see if he needs to stay on it or if he can stay off of it.  He is has not had any heart failure symptoms and weight is staying pretty steady between 222-226 lb.  His pressures are good and he has had continued cessation.     Expected Outcomes Continued cessation Short: Continue to work on weight loss lOng: continue to monitor risk factors. Short: Talk to doctor about jardiance Long; Continue to monitor risk factors.              Core Components/Risk Factors/Patient Goals at Discharge (Final Review):   Goals and Risk Factor Review - 11/03/23 1526       Core Components/Risk Factors/Patient Goals Review   Personal Goals Review Tobacco Cessation;Weight Management/Obesity;Hypertension;Heart Failure;Diabetes    Review Seth [name] is doing well in rehab. He has not had his jaurdiance for a month and his sugars are doing well.  He is waiting to see if he needs to stay on it or if he can stay off of it.  He is has not had any heart failure  symptoms and weight is staying pretty steady between 222-226 lb.  His  pressures are good and he has had continued cessation.    Expected Outcomes Short: Talk to doctor about jardiance Long; Continue to monitor risk factors.             ITP Comments:  ITP Comments     Row Name 09/21/23 1339 09/23/23 1003 09/30/23 0708 10/28/23 0832 11/25/23 1551   ITP Comments Patient attend orientation today.  Patient is attendingCardiac Rehabilitation Program.  Documentation for diagnosis can be found in [facility] encouner 06/27/23.  Reviewed medical chart, RPE/RPD, gym safety, and program guidelines.  Patient was fitted to equipment they will be using during rehab.  Patient is scheduled to start exercise on Wed 09/23/23.   Initial ITP created and sent for review and signature by Dr. Dina Rich, Medical Director for Cardiac Rehabilitation Program. First full day of exercise!  Patient was oriented to gym and equipment including functions, settings, policies, and procedures.  Patient's individual exercise prescription and treatment plan were reviewed.  All starting workloads were established based on the results of the 6 minute walk test done at initial orientation visit.  The plan for exercise progression was also introduced and progression will be customized based on patient's performance and goals. 30 day review completed. ITP sent to Dr. Dina Rich, Medical Director of Cardiac Rehab. Continue with ITP unless changes are made by physician.  Pt is new to program this round. 30 day review completed. ITP sent to Dr. Dina Rich, Medical Director of Cardiac Rehab. Continue with ITP unless changes are made by physician. 30 day review completed. ITP sent to Dr. Dina Rich, Medical Director of Cardiac Rehab. Continue with ITP unless changes are made by physician. Pt has not attended since 11/03/23 due to work. We have tried to reach out via telephone, but unable to leave a message.            Comments: 30  day review

## 2023-11-26 ENCOUNTER — Encounter (HOSPITAL_COMMUNITY): Payer: Medicaid Other

## 2023-11-27 ENCOUNTER — Encounter (HOSPITAL_COMMUNITY): Payer: Medicaid Other

## 2023-11-30 ENCOUNTER — Ambulatory Visit (HOSPITAL_COMMUNITY): Payer: Medicaid Other

## 2023-12-01 ENCOUNTER — Encounter (HOSPITAL_COMMUNITY): Payer: Medicaid Other

## 2023-12-02 ENCOUNTER — Encounter (HOSPITAL_COMMUNITY): Payer: Medicaid Other

## 2023-12-03 ENCOUNTER — Encounter (HOSPITAL_COMMUNITY): Payer: Medicaid Other

## 2023-12-04 ENCOUNTER — Ambulatory Visit (HOSPITAL_COMMUNITY): Payer: Medicaid Other

## 2023-12-07 ENCOUNTER — Ambulatory Visit (HOSPITAL_COMMUNITY): Payer: Medicaid Other

## 2023-12-09 ENCOUNTER — Ambulatory Visit (HOSPITAL_COMMUNITY): Payer: Medicaid Other

## 2023-12-11 ENCOUNTER — Encounter (HOSPITAL_COMMUNITY): Payer: Medicaid Other

## 2023-12-11 DIAGNOSIS — Z0279 Encounter for issue of other medical certificate: Secondary | ICD-10-CM

## 2023-12-14 ENCOUNTER — Encounter: Payer: Self-pay | Admitting: Nurse Practitioner

## 2023-12-14 ENCOUNTER — Telehealth: Payer: Self-pay | Admitting: Nurse Practitioner

## 2023-12-14 ENCOUNTER — Encounter (HOSPITAL_COMMUNITY): Payer: Medicaid Other

## 2023-12-14 ENCOUNTER — Ambulatory Visit: Payer: Medicaid Other | Attending: Nurse Practitioner | Admitting: Nurse Practitioner

## 2023-12-14 VITALS — BP 112/68 | HR 78 | Ht 75.0 in | Wt 225.0 lb

## 2023-12-14 DIAGNOSIS — I5032 Chronic diastolic (congestive) heart failure: Secondary | ICD-10-CM | POA: Diagnosis not present

## 2023-12-14 DIAGNOSIS — E785 Hyperlipidemia, unspecified: Secondary | ICD-10-CM | POA: Diagnosis not present

## 2023-12-14 DIAGNOSIS — R0789 Other chest pain: Secondary | ICD-10-CM

## 2023-12-14 DIAGNOSIS — I2510 Atherosclerotic heart disease of native coronary artery without angina pectoris: Secondary | ICD-10-CM

## 2023-12-14 DIAGNOSIS — E118 Type 2 diabetes mellitus with unspecified complications: Secondary | ICD-10-CM

## 2023-12-14 DIAGNOSIS — I5020 Unspecified systolic (congestive) heart failure: Secondary | ICD-10-CM

## 2023-12-14 NOTE — Patient Instructions (Addendum)
Medication Instructions:  Your physician recommends that you continue on your current medications as directed. Please refer to the Current Medication list given to you today.  Labwork: In 1-2 weeks   Testing/Procedures: Your physician has requested that you have an echocardiogram. Echocardiography is a painless test that uses sound waves to create images of your heart. It provides your doctor with information about the size and shape of your heart and how well your heart's chambers and valves are working. This procedure takes approximately one hour. There are no restrictions for this procedure. Please do NOT wear cologne, perfume, aftershave, or lotions (deodorant is allowed). Please arrive 15 minutes prior to your appointment time.  Please note: We ask at that you not bring children with you during ultrasound (echo/ vascular) testing. Due to room size and safety concerns, children are not allowed in the ultrasound rooms during exams. Our front office staff cannot provide observation of children in our lobby area while testing is being conducted. An adult accompanying a patient to their appointment will only be allowed in the ultrasound room at the discretion of the ultrasound technician under special circumstances. We apologize for any inconvenience.  Follow-Up: Your physician recommends that you schedule a follow-up appointment in: 3-4 months   Any Other Special Instructions Will Be Listed Below (If Applicable).  If you need a refill on your cardiac medications before your next appointment, please call your pharmacy.

## 2023-12-14 NOTE — Telephone Encounter (Signed)
Forms received from fax, Pt signed release and billing form. Paid $29 in cash. Cash has been placed in safe till charges drop. Forms given to clinical staff for completion.

## 2023-12-14 NOTE — Progress Notes (Signed)
Cardiology Office Note:  .   Date:  12/14/2023 ID:  Seth [name], DOB 08/20/1961, MRN [id number] PCP: [facility]  [facility] Providers Cardiologist:  Nanetta Batty, MD    History of Present Illness: .   Seth [name] is a 62 y.o. male with a PMH of CAD, s/p NSTEMI (06/2023), HFmrEF, HLD, T2DM, tobacco use, and BPH who presents today for 6-week follow-up.   Hospital admission 06/2023 for chest pain, was found to have elevated high sensitivity troponin with concerns for NSTEMI. Underwent cardiac cath on 7/7/and received successful PCI/DES x 1 to mLAD and DES x 1 to diagonal. Moderate nonobstructive residual disease along mLAD was recommended to be treated medically. Was also given IV Lasix in Cath Lab for elevated LV filling pressures. Recommended for DAPT with ASA/Brilinta x 1 year. TTE EF 45-50%, grade 1 DD.   Last seen for follow-up on September 14, 2023.  Was doing better since last office visit.  Did note "diaphoresis when he gets hot," overall he felt good but felt tired at times and attributed this to the heat.  Admitted to musculoskeletal chest pain along left side due to how he was sleeping at night.  Was staying active and changing his diet and had not had a cigarette in 3 months.   Today he presents for follow-up. Doing very well. Continues to note atypical chest pain, he believes is MSK CP, lasts 1 minute and then goes away, denies any triggers, says symptoms are not like when he had his heart attack. Says he will be starting at the [facility] next year to continue his cardiac rehab, says this has been difficult d/t his schedule, will be switching to part time work next year as well. Denies any shortness of breath, palpitations, syncope, presyncope, dizziness, orthopnea, PND, swelling or significant weight changes, acute bleeding, or claudication.  SH: Works at [employer]   Studies Reviewed: .    Echo 06/2023:  1. Left ventricular  ejection fraction, by estimation, is 45 to 50%. The  left ventricle has mildly decreased function. The left ventricle  demonstrates regional wall motion abnormalities (see scoring  diagram/findings for description). There is mild  concentric left ventricular hypertrophy. Left ventricular diastolic  parameters are consistent with Grade I diastolic dysfunction (impaired  relaxation).   2. Right ventricular systolic function is normal. The right ventricular  size is normal. Tricuspid regurgitation signal is inadequate for assessing  PA pressure.   3. The mitral valve is grossly normal. Trivial mitral valve  regurgitation.   4. The aortic valve is tricuspid. Aortic valve regurgitation is not  visualized.   5. Unable to estimate CVP.   Comparison(s): No prior Echocardiogram.  LHC 06/2023:    Prox RCA to Mid RCA lesion is 20% stenosed.   Prox Cx lesion is 20% stenosed.   Prox LAD to Mid LAD lesion is 99% stenosed.   1st Diag lesion is 99% stenosed.   Mid LAD lesion is 50% stenosed.   A drug-eluting stent was successfully placed using a SYNERGY XD 3.0X12.   A drug-eluting stent was successfully placed using a SYNERGY XD 3.0X16.   Post intervention, there is a 0% residual stenosis.   Post intervention, there is a 0% residual stenosis.   Acute anterolateral STEMI secondary to thrombotic sub-total occlusion of the mid LAD and large Diagonal branch.  Successful PTCA/DES x 1 mid LAD Successful PTCA/DES x 1 Diagonal Moderate non-obstructive residual disease in the mid LAD beyond  the stented segment Mild non-obstructive disease in the moderate caliber Circumflex and in the large dominant RCA Elevated LV filling pressure-40 mg IV Lasix given in the cath lab (LV 148/19/32) LV gram with hypokinesis of the anterior and apical walls.    Recommendations: Will transfer to the ICU. Echo today. Continue Aggrastat for 2 hours. DAPT with ASA and Brilinta for one year. High intensity statin. I anticipate  that he will need to be on a beta blocker but given soft BP post cath will not start today. He was given IV Lasix in the cath lab. Will write for an additional 40 mg IV lasix tonight.   Physical Exam:   VS:  BP 112/68   Pulse 78   Ht 6\' 3"  (1.905 m)   Wt 225 lb (102.1 kg)   SpO2 94%   BMI 28.12 kg/m    Wt Readings from Last 3 Encounters:  12/14/23 225 lb (102.1 kg)  09/21/23 227 lb 1.6 oz (103 kg)  09/14/23 223 lb (101.2 kg)    GEN: Well nourished, well developed in no acute distress NECK: No JVD; No carotid bruits CARDIAC: S1/S2, RRR, no murmurs, rubs, gallops RESPIRATORY:  Clear to auscultation without rales, wheezing or rhonchi  ABDOMEN: Soft, non-tender, non-distended EXTREMITIES:  No edema; No deformity   ASSESSMENT AND PLAN: .    CAD, s/p NSTEMI, atypical chest pain Admits to MSK chest pain, no anginal symptoms. Underwent cardiac cath 06/2023 and received successful PCI/DES x 1 to mLAD and DES x 1 to diagnonal.  Moderate nonobstructive residual disease in mid LAD to be treated medically.  Continue aspirin, Plavix, atorvastatin, losartan, carvedilol, and nitroglycerin as needed.  He is due for labs to be rechecked - and will be getting this checked with the health department soon.  Will arrange the following labs to be drawn: CBC, CMET, and fasting lipid panel.  Heart healthy diet and regular cardiovascular exercise encouraged. Care and ED precautions discussed. Have given him work note. Previously recommended Tylenol PRN instead of Advil.     2. HFimpEF Stage C, NYHA class I symptoms.  TTE 06/2023 revealed EF 45 to 50% with resolution in EF 09/2023, approximately 55%. Euvolemic and well compensated on exam.  Continue carvedilol, Jardiance, and losartan.  Unable to further titrate GDMT due to patient's BP.  Low sodium diet, fluid restriction <2L, and daily weights encouraged. Educated to contact our office for weight gain of 2 lbs overnight or 5 lbs in one week.   3. HLD Tolerating  Lipitor well.  Continue atorvastatin.  He is due for labs to be rechecked. Have arranged for CMET and fasting lipid panel to be checked at [facility] and for results to be faxed to our office.    Heart healthy diet and regular cardiovascular exercise encouraged.   4. T2DM A1C from 06/2023 was 6.8%, due for this to be rechecked. Will arrange A1C to be checked with Health Department. PCP to manage. Heart Healthy, diabetic diet encouraged. Continue to follow with PCP.  Dispo: Pt is requesting care closer to home. Follow-up with Dr. Donney Rankins or APP in 3-4 months weeks or sooner if anything changes.     Signed, Sharlene Dory, NP

## 2023-12-18 ENCOUNTER — Encounter (HOSPITAL_COMMUNITY): Payer: Medicaid Other

## 2023-12-18 NOTE — Patient Instructions (Signed)

## 2023-12-18 NOTE — Progress Notes (Unsigned)
   Established Patient Office Visit   Subjective  Patient ID: Seth [name], male    DOB: 10-Mar-1961  Age: 62 y.o. MRN: [id number]  No chief complaint on file.   He  has a past medical history of BPH (benign prostatic hyperplasia), Hyperlipidemia, Hypertension, NSTEMI (non-ST elevated myocardial infarction) (HCC), PONV (postoperative nausea and vomiting), and Type 2 diabetes mellitus (HCC).  HPI Patient presents to the clinic for chronic follow up. For the details of today's visit, please refer to assessment and plan.   ROS    Objective:     There were no vitals taken for this visit. {Vitals History (Optional):23777}  Physical Exam   No results found for any visits on 12/21/23.  The ASCVD Risk score (Arnett DK, et al., 2019) failed to calculate for the following reasons:   Risk score cannot be calculated because patient has a medical history suggesting prior/existing ASCVD    Assessment & Plan:  There are no diagnoses linked to this encounter.  No follow-ups on file.   Cruzita Lederer Newman Nip, FNP

## 2023-12-21 ENCOUNTER — Encounter (HOSPITAL_COMMUNITY): Payer: Medicaid Other

## 2023-12-21 ENCOUNTER — Encounter (INDEPENDENT_AMBULATORY_CARE_PROVIDER_SITE_OTHER): Payer: Medicaid Other | Admitting: Family Medicine

## 2023-12-21 DIAGNOSIS — I10 Essential (primary) hypertension: Secondary | ICD-10-CM

## 2023-12-21 DIAGNOSIS — E119 Type 2 diabetes mellitus without complications: Secondary | ICD-10-CM

## 2023-12-21 NOTE — Progress Notes (Signed)
NO SHOW

## 2023-12-21 NOTE — Telephone Encounter (Signed)
Received Completed Forms. Called PT and faxed billing form to billing to drop charge.

## 2023-12-22 ENCOUNTER — Encounter (HOSPITAL_COMMUNITY): Payer: Self-pay | Admitting: *Deleted

## 2023-12-22 DIAGNOSIS — Z955 Presence of coronary angioplasty implant and graft: Secondary | ICD-10-CM

## 2023-12-22 DIAGNOSIS — I214 Non-ST elevation (NSTEMI) myocardial infarction: Secondary | ICD-10-CM

## 2023-12-22 NOTE — Progress Notes (Signed)
 Cardiac Individual Treatment Plan  Patient Details  Name: Seth [name] MRN: [id number] Date of Birth: February 26, 1961 Referring Provider:   Flowsheet Row CARDIAC REHAB PHASE II ORIENTATION from 09/21/2023 in [facility] CARDIAC REHABILITATION  Referring Provider Court Carrier MD       Initial Encounter Date:  Flowsheet Row CARDIAC REHAB PHASE II ORIENTATION from 09/21/2023 in [facility] CARDIAC REHABILITATION  Date 09/21/23       Visit Diagnosis: NSTEMI (non-ST elevated myocardial infarction) [facility])  Status post coronary artery stent placement  Patient's Home Medications on Admission:  Current Outpatient Medications:    aspirin  EC 81 MG tablet, Take 1 tablet (81 mg total) by mouth daily. Swallow whole., Disp: 30 tablet, Rfl: 2   atorvastatin  (LIPITOR) 80 MG tablet, Take 1 tablet (80 mg total) by mouth daily., Disp: 90 tablet, Rfl: 1   carvedilol  (COREG ) 6.25 MG tablet, Take 1 tablet (6.25 mg total) by mouth 2 (two) times daily with a meal. (Patient not taking: Reported on 12/14/2023), Disp: 180 tablet, Rfl: 1   clopidogrel  (PLAVIX ) 75 MG tablet, Take 1 tablet (75 mg total) by mouth daily., Disp: 90 tablet, Rfl: 2   empagliflozin  (JARDIANCE ) 10 MG TABS tablet, Take 1 tablet (10 mg total) by mouth daily., Disp: 30 tablet, Rfl: 3   ibuprofen (ADVIL) 200 MG tablet, Take 200 mg by mouth every 6 (six) hours as needed for headache or mild pain., Disp: , Rfl:    losartan  (COZAAR ) 25 MG tablet, Take 1 tablet (25 mg total) by mouth daily., Disp: 90 tablet, Rfl: 1   Multiple Vitamin (MULTIVITAMIN WITH MINERALS) TABS tablet, Take 1 tablet by mouth daily., Disp: , Rfl:    nitroGLYCERIN  (NITROSTAT ) 0.4 MG SL tablet, Place 1 tablet (0.4 mg total) under the tongue every 5 (five) minutes x 3 doses as needed for chest pain., Disp: 25 tablet, Rfl: 2   tamsulosin  (FLOMAX ) 0.4 MG CAPS capsule, Take 0.4 mg by mouth daily., Disp: , Rfl:   Past Medical History: Past Medical History:  Diagnosis Date    BPH (benign prostatic hyperplasia)    Hyperlipidemia    Hypertension    NSTEMI (non-ST elevated myocardial infarction) (HCC)    PONV (postoperative nausea and vomiting)    Type 2 diabetes mellitus (HCC)     Tobacco Use: Social History   Tobacco Use  Smoking Status Former   Current packs/day: 1.00   Average packs/day: 1 pack/day for 44.0 years (44.0 ttl pk-yrs)   Types: Cigarettes   Start date: 29  Smokeless Tobacco Former  Tobacco Comments   Quit date 06/27/2023    Labs: Review Flowsheet  More data exists      Latest Ref Rng & Units 02/13/2010 03/08/2010 06/27/2023 06/28/2023 06/29/2023  Labs for ITP Cardiac and Pulmonary Rehab  Cholestrol 0 - 200 mg/dL 825        ATP III CLASSIFICATION:  <200     mg/dL   Desirable  799-760  mg/dL   Borderline High  >=759    mg/dL   High         - - 847  98   LDL (calc) 0 - 99 mg/dL 896        Total Cholesterol/HDL:CHD Risk Coronary Heart Disease Risk Table                     Men   Women  1/2 Average Risk   3.4   3.3  Average Risk  5.0   4.4  2 X Average Risk   9.6   7.1  3 X Average Risk  23.4   11.0        Use the calculated Patient Ratio above and the CHD Risk Table to determine the patient's CHD Risk.        ATP III CLASSIFICATION (LDL):  <100     mg/dL   Optimal  899-870  mg/dL   Near or Above                    Optimal  130-159  mg/dL   Borderline  839-810  mg/dL   High  >809     mg/dL   Very High  - - 72  65   HDL-C >40 mg/dL 35  - - 33  14   Trlycerides <150 mg/dL 820  - - 765  95   Hemoglobin A1c 4.8 - 5.6 % 5.9 (NOTE) The ADA recommends the following therapeutic goal for glycemic control related to Hgb A1c measurement: Goal of therapy: <6.5 Hgb A1c  Reference: American Diabetes Association: Clinical Practice Recommendations 2010, Diabetes Care, 2010, 33: (Suppl  1).  5.9  6.8  - -    Capillary Blood Glucose: Lab Results  Component Value Date   GLUCAP 88 10/29/2023   GLUCAP 127 (H) 10/02/2023   GLUCAP 134 (H)  09/28/2023   GLUCAP 148 (H) 09/28/2023   GLUCAP 97 09/23/2023     Exercise Target Goals: Exercise Program Goal: Individual exercise prescription set using results from initial 6 min walk test and THRR while considering  patient's activity barriers and safety.   Exercise Prescription Goal: Starting with aerobic activity 30 plus minutes a day, 3 days per week for initial exercise prescription. Provide home exercise prescription and guidelines that participant acknowledges understanding prior to discharge.  Activity Barriers & Risk Stratification:  Activity Barriers & Cardiac Risk Stratification - 09/21/23 1348       Activity Barriers & Cardiac Risk Stratification   Activity Barriers Shortness of Breath;Arthritis;Joint Problems;Balance Concerns   chronic bad knees, hips, shoulders from construction work   Cardiac Risk Stratification Moderate             6 Minute Walk:  6 Minute Walk     Row Name 09/21/23 1346         6 Minute Walk   Phase Initial     Distance 1637 feet     Walk Time 6 minutes     # of Rest Breaks 0     MPH 3.1     METS 3.97     RPE 9     VO2 Peak 13.89     Symptoms No     Resting HR 65 bpm     Resting BP 108/66     Resting Oxygen Saturation  94 %     Exercise Oxygen Saturation  during 6 min walk 95 %     Max Ex. HR 102 bpm     Max Ex. BP 128/74     2 Minute Post BP 112/66              Oxygen Initial Assessment:   Oxygen Re-Evaluation:   Oxygen Discharge (Final Oxygen Re-Evaluation):   Initial Exercise Prescription:  Initial Exercise Prescription - 09/21/23 1300       Date of Initial Exercise RX and Referring Provider   Date 09/21/23    Referring Provider Court Carrier MD  Oxygen   Maintain Oxygen Saturation 88% or higher      Treadmill   MPH 3.1    Grade 1    Minutes 15    METs 3.8      REL-XR   Level 3    Speed 50    Minutes 15    METs 3.5      Prescription Details   Frequency (times per week) 3     Duration Progress to 30 minutes of continuous aerobic without signs/symptoms of physical distress      Intensity   THRR 40-80% of Max Heartrate 103-140    Ratings of Perceived Exertion 11-13    Perceived Dyspnea 0-4      Progression   Progression Continue to progress workloads to maintain intensity without signs/symptoms of physical distress.      Resistance Training   Training Prescription Yes    Weight 5 lb    Reps 10-15             Perform Capillary Blood Glucose checks as needed.  Exercise Prescription Changes:   Exercise Prescription Changes     Row Name 09/21/23 1300 10/22/23 1500 11/03/23 1200         Response to Exercise   Blood Pressure (Admit) 108/66 126/72 126/64     Blood Pressure (Exercise) 128/74 146/72 --     Blood Pressure (Exit) 112/66 126/72 126/72     Heart Rate (Admit) 65 bpm 73 bpm 70 bpm     Heart Rate (Exercise) 102 bpm 118 bpm 115 bpm     Heart Rate (Exit) 67 bpm 75 bpm 83 bpm     Oxygen Saturation (Admit) 94 % -- --     Oxygen Saturation (Exercise) 95 % -- --     Rating of Perceived Exertion (Exercise) 9 13 13      Symptoms none -- --     Comments walk test results -- --     Duration -- Continue with 30 min of aerobic exercise without signs/symptoms of physical distress. Continue with 30 min of aerobic exercise without signs/symptoms of physical distress.     Intensity -- THRR unchanged THRR unchanged       Progression   Progression -- Continue to progress workloads to maintain intensity without signs/symptoms of physical distress. Continue to progress workloads to maintain intensity without signs/symptoms of physical distress.       Resistance Training   Training Prescription -- Yes Yes     Weight -- 5 lbs / blue band 5 lbs     Reps -- 10-15 10-15       Treadmill   MPH -- 3.5 3     Grade -- 2 3     Minutes -- 15 15     METs -- 4.65 4.54       NuStep   Level -- 5 5     SPM -- 60 60     Minutes -- 15 15     METs -- 2.8 3.6               Exercise Comments:   Exercise Comments     Row Name 09/23/23 1003           Exercise Comments First full day of exercise!  Patient was oriented to gym and equipment including functions, settings, policies, and procedures.  Patient's individual exercise prescription and treatment plan were reviewed.  All starting workloads were established based on the results of the 6 minute  walk test done at initial orientation visit.  The plan for exercise progression was also introduced and progression will be customized based on patient's performance and goals.                Exercise Goals and Review:   Exercise Goals     Row Name 09/21/23 1357             Exercise Goals   Increase Physical Activity Yes       Intervention Develop an individualized exercise prescription for aerobic and resistive training based on initial evaluation findings, risk stratification, comorbidities and participant's personal goals.;Provide advice, education, support and counseling about physical activity/exercise needs.       Expected Outcomes Short Term: Attend rehab on a regular basis to increase amount of physical activity.;Long Term: Add in home exercise to make exercise part of routine and to increase amount of physical activity.;Long Term: Exercising regularly at least 3-5 days a week.       Increase Strength and Stamina Yes       Intervention Provide advice, education, support and counseling about physical activity/exercise needs.;Develop an individualized exercise prescription for aerobic and resistive training based on initial evaluation findings, risk stratification, comorbidities and participant's personal goals.       Expected Outcomes Short Term: Increase workloads from initial exercise prescription for resistance, speed, and METs.;Long Term: Improve cardiorespiratory fitness, muscular endurance and strength as measured by increased METs and functional capacity ( );Short Term: Perform  resistance training exercises routinely during rehab and add in resistance training at home       Able to understand and use rate of perceived exertion (RPE) scale Yes       Intervention Provide education and explanation on how to use RPE scale       Expected Outcomes Short Term: Able to use RPE daily in rehab to express subjective intensity level;Long Term:  Able to use RPE to guide intensity level when exercising independently       Able to understand and use Dyspnea scale Yes       Intervention Provide education and explanation on how to use Dyspnea scale       Expected Outcomes Short Term: Able to use Dyspnea scale daily in rehab to express subjective sense of shortness of breath during exertion;Long Term: Able to use Dyspnea scale to guide intensity level when exercising independently       Knowledge and understanding of Target Heart Rate Range (THRR) Yes       Intervention Provide education and explanation of THRR including how the numbers were predicted and where they are located for reference       Expected Outcomes Short Term: Able to state/look up THRR;Long Term: Able to use THRR to govern intensity when exercising independently;Short Term: Able to use daily as guideline for intensity in rehab       Able to check pulse independently Yes       Intervention Review the importance of being able to check your own pulse for safety during independent exercise;Provide education and demonstration on how to check pulse in carotid and radial arteries.       Expected Outcomes Short Term: Able to explain why pulse checking is important during independent exercise;Long Term: Able to check pulse independently and accurately       Understanding of Exercise Prescription Yes       Intervention Provide education, explanation, and written materials on patient's individual exercise prescription  Expected Outcomes Short Term: Able to explain program exercise prescription;Long Term: Able to explain home  exercise prescription to exercise independently                Exercise Goals Re-Evaluation :  Exercise Goals Re-Evaluation     Row Name 09/23/23 1003 10/20/23 1458 11/03/23 1523         Exercise Goal Re-Evaluation   Exercise Goals Review Able to understand and use rate of perceived exertion (RPE) scale;Able to understand and use Dyspnea scale;Knowledge and understanding of Target Heart Rate Range (THRR);Understanding of Exercise Prescription Increase Physical Activity;Increase Strength and Stamina;Understanding of Exercise Prescription Increase Physical Activity;Increase Strength and Stamina;Understanding of Exercise Prescription     Comments Reviewed RPE and dyspnea scale, THR and program prescription with pt today.  Pt voiced understanding and was given a copy of goals to take home. Seth [name] is doing well in rehab. He has missed the last couple of weeks due to work conflicts.  He has now switched classes to be more accomodating to his schedule.  He has missed coming to class and not doing as much at home.  He is surprised that he is still going and not too tired in class. Seth [name] is doing well in rehab.  He is walking some at home and staying active at work.  He is not doing any formal exercise on his off days. We talked about importance of keeping up exercise at home.  His stamina has been improving.     Expected Outcomes Short: Use RPE daily to regulate intensity.  Long: Follow program prescription in THR. Short; Return to consistent attendance Long: Continue to improve stamina Short: Add in more exercise on off days Long; Continue to exercise independently               Discharge Exercise Prescription (Final Exercise Prescription Changes):  Exercise Prescription Changes - 11/03/23 1200       Response to Exercise   Blood Pressure (Admit) 126/64    Blood Pressure (Exit) 126/72    Heart Rate (Admit) 70 bpm    Heart Rate (Exercise) 115 bpm    Heart Rate (Exit) 83 bpm    Rating of  Perceived Exertion (Exercise) 13    Duration Continue with 30 min of aerobic exercise without signs/symptoms of physical distress.    Intensity THRR unchanged      Progression   Progression Continue to progress workloads to maintain intensity without signs/symptoms of physical distress.      Resistance Training   Training Prescription Yes    Weight 5 lbs    Reps 10-15      Treadmill   MPH 3    Grade 3    Minutes 15    METs 4.54      NuStep   Level 5    SPM 60    Minutes 15    METs 3.6             Nutrition:  Target Goals: Understanding of nutrition guidelines, daily intake of sodium 1500mg , cholesterol 200mg , calories 30% from fat and 7% or less from saturated fats, daily to have 5 or more servings of fruits and vegetables.  Biometrics:  Pre Biometrics - 09/21/23 1358       Pre Biometrics   Height 6' 2 (1.88 m)    Weight 227 lb 1.6 oz (103 kg)    Waist Circumference 40 inches    Hip Circumference 41.5 inches    Waist to Hip  Ratio 0.96 %    BMI (Calculated) 29.15    Grip Strength 37.8 kg    Single Leg Stand 4.2 seconds              Nutrition Therapy Plan and Nutrition Goals:  Nutrition Therapy & Goals - 09/21/23 1359       Intervention Plan   Intervention Prescribe, educate and counsel regarding individualized specific dietary modifications aiming towards targeted core components such as weight, hypertension, lipid management, diabetes, heart failure and other comorbidities.    Expected Outcomes Short Term Goal: Understand basic principles of dietary content, such as calories, fat, sodium, cholesterol and nutrients.;Long Term Goal: Adherence to prescribed nutrition plan.             Nutrition Assessments:  MEDIFICTS Score Key: >=70 Need to make dietary changes  40-70 Heart Healthy Diet <= 40 Therapeutic Level Cholesterol Diet   Picture Your Plate Scores: <59 Unhealthy dietary pattern with much room for improvement. 41-50 Dietary pattern  unlikely to meet recommendations for good health and room for improvement. 51-60 More healthful dietary pattern, with some room for improvement.  >60 Healthy dietary pattern, although there may be some specific behaviors that could be improved.    Nutrition Goals Re-Evaluation:  Nutrition Goals Re-Evaluation     Row Name 10/20/23 1502 11/03/23 1528           Goals   Nutrition Goal Heart Healthy Diet short: Continue to cut back on salt and sugar Long: Conitnue to try lean versions of protein      Comment Seth [name] is doing well in rehab.  He is trying to eat well for most part.  But he tries to practice moderation when he eats something that is not as good for him.  He is eating more oatmeal now.  He is trying to limit his salt and sugar.  He is getting in lots of peanut butter and beans for protein source in addition to meat.  We talked about limiting his fatty meats.  He will continue to try to eat well. Seth [name] is doing well with his diet.  He is watching his saturated fats and getting lean proteins.  He wants to use his air fryer.  He is still watching his sodium and sugars, but they have greatly improved.  He is still doing good with fruits and vegetables.  He has been cooking at home and getting low sodium soups too.      Expected Outcome short: Continue to cut back on salt and sugar Long: Conitnue to try lean versions of protein Short: Continue to improve diet Long: Conitnue to eat lots of fruits and vegetables               Nutrition Goals Discharge (Final Nutrition Goals Re-Evaluation):  Nutrition Goals Re-Evaluation - 11/03/23 1528       Goals   Nutrition Goal short: Continue to cut back on salt and sugar Long: Conitnue to try lean versions of protein    Comment Seth [name] is doing well with his diet.  He is watching his saturated fats and getting lean proteins.  He wants to use his air fryer.  He is still watching his sodium and sugars, but they have greatly improved.  He is still doing  good with fruits and vegetables.  He has been cooking at home and getting low sodium soups too.    Expected Outcome Short: Continue to improve diet Long: Conitnue to eat lots of fruits and vegetables  Psychosocial: Target Goals: Acknowledge presence or absence of significant depression and/or stress, maximize coping skills, provide positive support system. Participant is able to verbalize types and ability to use techniques and skills needed for reducing stress and depression.  Initial Review & Psychosocial Screening:  Initial Psych Review & Screening - 09/16/23 1040       Initial Review   Current issues with None Identified      Family Dynamics   Good Support System? Yes      Barriers   Psychosocial barriers to participate in program There are no identifiable barriers or psychosocial needs.;The patient should benefit from training in stress management and relaxation.      Screening Interventions   Interventions To provide support and resources with identified psychosocial needs;Provide feedback about the scores to participant;Encouraged to exercise    Expected Outcomes Short Term goal: Utilizing psychosocial counselor, staff and physician to assist with identification of specific Stressors or current issues interfering with healing process. Setting desired goal for each stressor or current issue identified.;Long Term Goal: Stressors or current issues are controlled or eliminated.;Short Term goal: Identification and review with participant of any Quality of Life or Depression concerns found by scoring the questionnaire.;Long Term goal: The participant improves quality of Life and PHQ9 Scores as seen by post scores and/or verbalization of changes             Quality of Life Scores:  Quality of Life - 09/21/23 1359       Quality of Life   Select Quality of Life      Quality of Life Scores   Health/Function Pre 24.46 %    Socioeconomic Pre 26.81 %     Psych/Spiritual Pre 24.29 %    Family Pre 26 %    GLOBAL Pre 25.25 %            Scores of 19 and below usually indicate a poorer quality of life in these areas.  A difference of  2-3 points is a clinically meaningful difference.  A difference of 2-3 points in the total score of the Quality of Life Index has been associated with significant improvement in overall quality of life, self-image, physical symptoms, and general health in studies assessing change in quality of life.  PHQ-9: Review Flowsheet       09/21/2023 08/20/2023  Depression screen PHQ 2/9  Decreased Interest 0 0  Down, Depressed, Hopeless 0 0  PHQ - 2 Score 0 0  Altered sleeping 0 0  Tired, decreased energy 0 0  Change in appetite 0 0  Feeling bad or failure about yourself  0 0  Trouble concentrating 0 0  Moving slowly or fidgety/restless 0 0  Suicidal thoughts 0 0  PHQ-9 Score 0 0  Difficult doing work/chores Not difficult at all Not difficult at all   Interpretation of Total Score  Total Score Depression Severity:  1-4 = Minimal depression, 5-9 = Mild depression, 10-14 = Moderate depression, 15-19 = Moderately severe depression, 20-27 = Severe depression   Psychosocial Evaluation and Intervention:  Psychosocial Evaluation - 09/16/23 1042       Psychosocial Evaluation & Interventions   Interventions Stress management education;Encouraged to exercise with the program and follow exercise prescription    Comments Patient was referred to Cr with NSTEMI and stent placement 06/27/23. His CR was delayed due to not having health insurance. He denies any depression or anxiety. He has been out of work since his event and is ready to go  back. He has been released to start back Tuesday 10/1. 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 He says he is very active on his job. He denies any stressors in his life. He says his mother passed away in 06/05/24. He lives alone but has good support from his sister and some close  friends. His main goals for the program are to learn what he is able to do; make sure his heart is working okay and to get back to his normal life. He says he sleeps well at night. He has not been doing any exercising but has been doing some holiday representative work around his house. He is not sure how much of the program he wants to do due to having to miss work but has committed to at least 21 sessions and will see how it goes.  His work schedule could be a possible barriers for him to complete 36 sessions.    Expected Outcomes Short Term: start the program and attend consistenly. Long Term: Meet his personal goals.    Continue Psychosocial Services  Follow up required by staff             Psychosocial Re-Evaluation:  Psychosocial Re-Evaluation     Row Name 10/20/23 1501 11/03/23 1525           Psychosocial Re-Evaluation   Current issues with Current Stress Concerns Current Stress Concerns      Comments Seth [name] returned to rehab today after being out with work conflicts.  He has switched classes to be accomodate his schedule. He got a good report from doctor and echo last week.  He is sleeping well.  He is generally positive and enjoys life and his job. Seth [name] is doing well in rehab.  Work continues to be his biggest stressor.  He lives on his own and just looks after his dogs, cats, and himself.  He is feeling good and staying positive.      Expected Outcomes short: Conitnue to come to rehab to exercise Long: conitnue to exercise for mental boost Short: Conitnue to exercies for mental boost Long; Conitnue to stay positive      Interventions Encouraged to attend Cardiac Rehabilitation for the exercise Encouraged to attend Cardiac Rehabilitation for the exercise      Continue Psychosocial Services  Follow up required by staff Follow up required by staff               Psychosocial Discharge (Final Psychosocial Re-Evaluation):  Psychosocial Re-Evaluation - 11/03/23 1525       Psychosocial  Re-Evaluation   Current issues with Current Stress Concerns    Comments Seth [name] is doing well in rehab.  Work continues to be his biggest stressor.  He lives on his own and just looks after his dogs, cats, and himself.  He is feeling good and staying positive.    Expected Outcomes Short: Conitnue to exercies for mental boost Long; Conitnue to stay positive    Interventions Encouraged to attend Cardiac Rehabilitation for the exercise    Continue Psychosocial Services  Follow up required by staff             Vocational Rehabilitation: Provide vocational rehab assistance to qualifying candidates.   Vocational Rehab Evaluation & Intervention:  Vocational Rehab - 09/16/23 1041       Initial Vocational Rehab Evaluation & Intervention   Assessment shows need for Vocational Rehabilitation No      Vocational Rehab Re-Evaulation   Comments Patient is returning to  work next week at [employer].             Education: Education Goals: Education classes will be provided on a weekly basis, covering required topics. Participant will state understanding/return demonstration of topics presented.  Learning Barriers/Preferences:  Learning Barriers/Preferences - 09/16/23 1040       Learning Barriers/Preferences   Learning Barriers None    Learning Preferences Skilled Demonstration             Education Topics: Hypertension, Hypertension Reduction -Define heart disease and high blood pressure. Discus how high blood pressure affects the body and ways to reduce high blood pressure.   Exercise and Your Heart -Discuss why it is important to exercise, the FITT principles of exercise, normal and abnormal responses to exercise, and how to exercise safely. Flowsheet Row CARDIAC REHAB PHASE II EXERCISE from 10/29/2023 in [place] [facility]  Date 09/30/23  junie 2]  Educator [facility]  Instruction Review Code 1- Verbalizes Understanding       Angina -Discuss  definition of angina, causes of angina, treatment of angina, and how to decrease risk of having angina.   Cardiac Medications -Review what the following cardiac medications are used for, how they affect the body, and side effects that may occur when taking the medications.  Medications include Aspirin , Beta blockers, calcium  channel blockers, ACE Inhibitors, angiotensin receptor blockers, diuretics, digoxin, and antihyperlipidemics. Flowsheet Row CARDIAC REHAB PHASE II EXERCISE from 10/29/2023 in [place] [facility]  Date 10/29/23  Educator hb  Instruction Review Code 1- Verbalizes Understanding       Congestive Heart Failure -Discuss the definition of CHF, how to live with CHF, the signs and symptoms of CHF, and how keep track of weight and sodium intake. Flowsheet Row CARDIAC REHAB PHASE II EXERCISE from 10/29/2023 in [place] [facility]  Date 10/22/23  Educator jh  Instruction Review Code 1- Verbalizes Understanding       Heart Disease and Intimacy -Discus the effect sexual activity has on the heart, how changes occur during intimacy as we age, and safety during sexual activity. Flowsheet Row CARDIAC REHAB PHASE II EXERCISE from 10/29/2023 in [place] [facility]  Date 09/23/23  Educator HB  Instruction Review Code 1- Verbalizes Understanding       Smoking Cessation / COPD -Discuss different methods to quit smoking, the health benefits of quitting smoking, and the definition of COPD.   Nutrition I: Fats -Discuss the types of cholesterol, what cholesterol does to the heart, and how cholesterol levels can be controlled.   Nutrition II: Labels -Discuss the different components of food labels and how to read food label   Heart Parts/Heart Disease and PAD -Discuss the anatomy of the heart, the pathway of blood circulation through the heart, and these are affected by heart disease.   Stress I: Signs and Symptoms -Discuss the causes  of stress, how stress may lead to anxiety and depression, and ways to limit stress.   Stress II: Relaxation -Discuss different types of relaxation techniques to limit stress.   Warning Signs of Stroke / TIA -Discuss definition of a stroke, what the signs and symptoms are of a stroke, and how to identify when someone is having stroke.   Knowledge Questionnaire Score:  Knowledge Questionnaire Score - 09/21/23 1359       Knowledge Questionnaire Score   Pre Score 20/24             Core Components/Risk Factors/Patient Goals at Admission:  Personal Goals and Risk Factors at Admission - 09/21/23 1359       Core Components/Risk Factors/Patient Goals on Admission    Weight Management Weight Maintenance;Yes;Weight Loss    Intervention Weight Management: Develop a combined nutrition and exercise program designed to reach desired caloric intake, while maintaining appropriate intake of nutrient and fiber, sodium and fats, and appropriate energy expenditure required for the weight goal.;Weight Management: Provide education and appropriate resources to help participant work on and attain dietary goals.    Admit Weight 227 lb 1.6 oz (103 kg)    Goal Weight: Short Term 220 lb (99.8 kg)    Goal Weight: Long Term 215 lb (97.5 kg)    Expected Outcomes Short Term: Continue to assess and modify interventions until short term weight is achieved;Long Term: Adherence to nutrition and physical activity/exercise program aimed toward attainment of established weight goal;Weight Loss: Understanding of general recommendations for a balanced deficit meal plan, which promotes 1-2 lb weight loss per week and includes a negative energy balance of [phone] kcal/d;Understanding recommendations for meals to include 15-35% energy as protein, 25-35% energy from fat, 35-60% energy from carbohydrates, less than 200mg  of dietary cholesterol, 20-35 gm of total fiber daily;Understanding of distribution of calorie intake  throughout the day with the consumption of 4-5 meals/snacks    Tobacco Cessation Yes   quit in July   Intervention Assist the participant in steps to quit. Provide individualized education and counseling about committing to Tobacco Cessation, relapse prevention, and pharmacological support that can be provided by physician.    Expected Outcomes Long Term: Complete abstinence from all tobacco products for at least 12 months from quit date.    Improve shortness of breath with ADL's Yes    Intervention Provide education, individualized exercise plan and daily activity instruction to help decrease symptoms of SOB with activities of daily living.    Expected Outcomes Short Term: Improve cardiorespiratory fitness to achieve a reduction of symptoms when performing ADLs;Long Term: Be able to perform more ADLs without symptoms or delay the onset of symptoms    Diabetes Yes    Intervention Provide education about signs/symptoms and action to take for hypo/hyperglycemia.;Provide education about proper nutrition, including hydration, and aerobic/resistive exercise prescription along with prescribed medications to achieve blood glucose in normal ranges: Fasting glucose 65-99 mg/dL    Expected Outcomes Short Term: Participant verbalizes understanding of the signs/symptoms and immediate care of hyper/hypoglycemia, proper foot care and importance of medication, aerobic/resistive exercise and nutrition plan for blood glucose control.;Long Term: Attainment of HbA1C < 7%.    Heart Failure Yes    Intervention Provide a combined exercise and nutrition program that is supplemented with education, support and counseling about heart failure. Directed toward relieving symptoms such as shortness of breath, decreased exercise tolerance, and extremity edema.    Expected Outcomes Improve functional capacity of life;Short term: Attendance in program 2-3 days a week with increased exercise capacity. Reported lower sodium intake.  Reported increased fruit and vegetable intake. Reports medication compliance.;Short term: Daily weights obtained and reported for increase. Utilizing diuretic protocols set by physician.;Long term: Adoption of self-care skills and reduction of barriers for early signs and symptoms recognition and intervention leading to self-care maintenance.    Hypertension Yes    Intervention Provide education on lifestyle modifcations including regular physical activity/exercise, weight management, moderate sodium restriction and increased consumption of fresh fruit, vegetables, and low fat dairy, alcohol moderation, and smoking cessation.;Monitor prescription use compliance.    Expected Outcomes Short  Term: Continued assessment and intervention until BP is < 140/42mm HG in hypertensive participants. < 130/1mm HG in hypertensive participants with diabetes, heart failure or chronic kidney disease.;Long Term: Maintenance of blood pressure at goal levels.    Lipids Yes    Intervention Provide education and support for participant on nutrition & aerobic/resistive exercise along with prescribed medications to achieve LDL 70mg , HDL >40mg .    Expected Outcomes Short Term: Participant states understanding of desired cholesterol values and is compliant with medications prescribed. Participant is following exercise prescription and nutrition guidelines.;Long Term: Cholesterol controlled with medications as prescribed, with individualized exercise RX and with personalized nutrition plan. Value goals: LDL < 70mg , HDL > 40 mg.             Core Components/Risk Factors/Patient Goals Review:   Goals and Risk Factor Review     Row Name 09/21/23 1403 10/20/23 1506 11/03/23 1526         Core Components/Risk Factors/Patient Goals Review   Personal Goals Review Tobacco Cessation Tobacco Cessation;Weight Management/Obesity;Hypertension;Heart Failure;Diabetes Tobacco Cessation;Weight Management/Obesity;Hypertension;Heart  Failure;Diabetes     Review Seth [name] has recently quit tobacco use within the last 6 months. Intervention for relapse prevention was provided at the initial medical review. He was encouraged to continue to with tobacco cessation and has had no cravings.  Patient was encouraged to ask staff for assistance if cravings return.  Staff will continue to provide encouragement and follow up with the patient throughout the program. Seth [name] is doing well in rehab.  He got a good report on his echo and at office last week.  He is feeling better overall.  His pressures and sugars are doing well.  He is checking them both at home on occassion.  He continues to stay away from cigarette and no longer craving them.  He is starting to lose some weight. Seth [name] is doing well in rehab. He has not had his jaurdiance for a month and his sugars are doing well.  He is waiting to see if he needs to stay on it or if he can stay off of it.  He is has not had any heart failure symptoms and weight is staying pretty steady between 222-226 lb.  His pressures are good and he has had continued cessation.     Expected Outcomes Continued cessation Short: Continue to work on weight loss lOng: continue to monitor risk factors. Short: Talk to doctor about jardiance  Long; Continue to monitor risk factors.              Core Components/Risk Factors/Patient Goals at Discharge (Final Review):   Goals and Risk Factor Review - 11/03/23 1526       Core Components/Risk Factors/Patient Goals Review   Personal Goals Review Tobacco Cessation;Weight Management/Obesity;Hypertension;Heart Failure;Diabetes    Review Seth [name] is doing well in rehab. He has not had his jaurdiance for a month and his sugars are doing well.  He is waiting to see if he needs to stay on it or if he can stay off of it.  He is has not had any heart failure symptoms and weight is staying pretty steady between 222-226 lb.  His pressures are good and he has had continued cessation.     Expected Outcomes Short: Talk to doctor about jardiance  Long; Continue to monitor risk factors.             ITP Comments:  ITP Comments     Row Name 09/21/23 1339 09/23/23 1003 09/30/23 0708 10/28/23 9167 11/25/23  1551   ITP Comments Patient attend orientation today.  Patient is attendingCardiac Rehabilitation Program.  Documentation for diagnosis can be found in [facility] encouner 06/27/23.  Reviewed medical chart, RPE/RPD, gym safety, and program guidelines.  Patient was fitted to equipment they will be using during rehab.  Patient is scheduled to start exercise on Wed 09/23/23.   Initial ITP created and sent for review and signature by Dr. Dorn Ross, Medical Director for Cardiac Rehabilitation Program. First full day of exercise!  Patient was oriented to gym and equipment including functions, settings, policies, and procedures.  Patient's individual exercise prescription and treatment plan were reviewed.  All starting workloads were established based on the results of the 6 minute walk test done at initial orientation visit.  The plan for exercise progression was also introduced and progression will be customized based on patient's performance and goals. 30 day review completed. ITP sent to Dr. Dorn Ross, Medical Director of Cardiac Rehab. Continue with ITP unless changes are made by physician.  Pt is new to program this round. 30 day review completed. ITP sent to Dr. Dorn Ross, Medical Director of Cardiac Rehab. Continue with ITP unless changes are made by physician. 30 day review completed. ITP sent to Dr. Dorn Ross, Medical Director of Cardiac Rehab. Continue with ITP unless changes are made by physician. Pt has not attended since 11/03/23 due to work. We have tried to reach out via telephone, but unable to leave a message.    Row Name 12/22/23 1446           ITP Comments Discharge ITP.  Medicaid only allows for 12 weeks in program which was 12/04/23.  Seth [name] had not returned to  rehab since 11/03/23. He completed 13 sessions.                Comments: Discharge ITP

## 2023-12-22 NOTE — Progress Notes (Signed)
 Discharge Progress Report  Patient Details  Name: Seth [name] MRN: [id number] Date of Birth: 03/23/1961 Referring Provider:   Flowsheet Row CARDIAC REHAB PHASE II ORIENTATION from 09/21/2023 in [facility]  Referring Provider Court Carrier MD      Number of Visits: 13  Reason for Discharge:  Early Exit:  Insurance, Back to work, and Lack of attendance  Smoking History:  Social History   Tobacco Use  Smoking Status Former   Current packs/day: 1.00   Average packs/day: 1 pack/day for 44.0 years (44.0 ttl pk-yrs)   Types: Cigarettes   Start date: 1981  Smokeless Tobacco Former  Tobacco Comments   Quit date 06/27/2023   Diagnosis:  NSTEMI (non-ST elevated myocardial infarction) (HCC)  Status post coronary artery stent placement  Initial Exercise Prescription:  Initial Exercise Prescription - 09/21/23 1300       Date of Initial Exercise RX and Referring Provider   Date 09/21/23    Referring Provider Court Carrier MD      Oxygen   Maintain Oxygen Saturation 88% or higher      Treadmill   MPH 3.1    Grade 1    Minutes 15    METs 3.8      REL-XR   Level 3    Speed 50    Minutes 15    METs 3.5      Prescription Details   Frequency (times per week) 3    Duration Progress to 30 minutes of continuous aerobic without signs/symptoms of physical distress      Intensity   THRR 40-80% of Max Heartrate 103-140    Ratings of Perceived Exertion 11-13    Perceived Dyspnea 0-4      Progression   Progression Continue to progress workloads to maintain intensity without signs/symptoms of physical distress.      Resistance Training   Training Prescription Yes    Weight 5 lb    Reps 10-15             Discharge Exercise Prescription (Final Exercise Prescription Changes):  Exercise Prescription Changes - 11/03/23 1200       Response to Exercise   Blood Pressure (Admit) 126/64    Blood Pressure (Exit) 126/72    Heart Rate (Admit) 70  bpm    Heart Rate (Exercise) 115 bpm    Heart Rate (Exit) 83 bpm    Rating of Perceived Exertion (Exercise) 13    Duration Continue with 30 min of aerobic exercise without signs/symptoms of physical distress.    Intensity THRR unchanged      Progression   Progression Continue to progress workloads to maintain intensity without signs/symptoms of physical distress.      Resistance Training   Training Prescription Yes    Weight 5 lbs    Reps 10-15      Treadmill   MPH 3    Grade 3    Minutes 15    METs 4.54      NuStep   Level 5    SPM 60    Minutes 15    METs 3.6             Functional Capacity:  6 Minute Walk     Row Name 09/21/23 1346         6 Minute Walk   Phase Initial     Distance 1637 feet     Walk Time 6 minutes     # of Rest Breaks  0     MPH 3.1     METS 3.97     RPE 9     VO2 Peak 13.89     Symptoms No     Resting HR 65 bpm     Resting BP 108/66     Resting Oxygen Saturation  94 %     Exercise Oxygen Saturation  during 6 min walk 95 %     Max Ex. HR 102 bpm     Max Ex. BP 128/74     2 Minute Post BP 112/66              Psychological, QOL, Others - Outcomes: PHQ 2/9:    09/21/2023    2:00 PM 08/20/2023    9:44 AM  Depression screen PHQ 2/9  Decreased Interest 0 0  Down, Depressed, Hopeless 0 0  PHQ - 2 Score 0 0  Altered sleeping 0 0  Tired, decreased energy 0 0  Change in appetite 0 0  Feeling bad or failure about yourself  0 0  Trouble concentrating 0 0  Moving slowly or fidgety/restless 0 0  Suicidal thoughts 0 0  PHQ-9 Score 0 0  Difficult doing work/chores Not difficult at all Not difficult at all    Quality of Life:  Quality of Life - 09/21/23 1359       Quality of Life   Select Quality of Life      Quality of Life Scores   Health/Function Pre 24.46 %    Socioeconomic Pre 26.81 %    Psych/Spiritual Pre 24.29 %    Family Pre 26 %    GLOBAL Pre 25.25 %            Nutrition & Weight - Outcomes:  Pre  Biometrics - 09/21/23 1358       Pre Biometrics   Height 6' 2 (1.88 m)    Weight 227 lb 1.6 oz (103 kg)    Waist Circumference 40 inches    Hip Circumference 41.5 inches    Waist to Hip Ratio 0.96 %    BMI (Calculated) 29.15    Grip Strength 37.8 kg    Single Leg Stand 4.2 seconds

## 2023-12-24 ENCOUNTER — Other Ambulatory Visit: Payer: Medicaid Other

## 2023-12-25 ENCOUNTER — Encounter (HOSPITAL_COMMUNITY): Payer: Medicaid Other

## 2023-12-26 LAB — COMPREHENSIVE METABOLIC PANEL
ALT: 19 [IU]/L (ref 0–44)
AST: 18 [IU]/L (ref 0–40)
Albumin: 4.5 g/dL (ref 3.9–4.9)
Alkaline Phosphatase: 105 [IU]/L (ref 44–121)
BUN/Creatinine Ratio: 14 (ref 10–24)
BUN: 12 mg/dL (ref 8–27)
Bilirubin Total: 0.8 mg/dL (ref 0.0–1.2)
CO2: 22 mmol/L (ref 20–29)
Calcium: 9.4 mg/dL (ref 8.6–10.2)
Chloride: 104 mmol/L (ref 96–106)
Creatinine, Ser: 0.83 mg/dL (ref 0.76–1.27)
Globulin, Total: 2.3 g/dL (ref 1.5–4.5)
Glucose: 146 mg/dL — ABNORMAL HIGH (ref 70–99)
Potassium: 4.2 mmol/L (ref 3.5–5.2)
Sodium: 141 mmol/L (ref 134–144)
Total Protein: 6.8 g/dL (ref 6.0–8.5)
eGFR: 99 mL/min/{1.73_m2} (ref >59)

## 2023-12-26 LAB — HEMOGLOBIN A1C
Est. average glucose Bld gHb Est-mCnc: 151 mg/dL
Hgb A1c MFr Bld: 6.9 % — ABNORMAL HIGH (ref 4.8–5.6)

## 2023-12-26 LAB — CBC
Hematocrit: 47.4 % (ref 37.5–51.0)
Hemoglobin: 15.8 g/dL (ref 13.0–17.7)
MCH: 28.9 pg (ref 26.6–33.0)
MCHC: 33.3 g/dL (ref 31.5–35.7)
MCV: 87 fL (ref 79–97)
Platelets: 354 10*3/uL (ref 150–450)
RBC: 5.47 x10E6/uL (ref 4.14–5.80)
RDW: 14.1 % (ref 11.6–15.4)
WBC: 9.4 10*3/uL (ref 3.4–10.8)

## 2023-12-26 LAB — LIPID PANEL
Chol/HDL Ratio: 2.2 {ratio} (ref 0.0–5.0)
Cholesterol, Total: 93 mg/dL — ABNORMAL LOW (ref 100–199)
HDL: 43 mg/dL (ref >39)
LDL Chol Calc (NIH): 36 mg/dL (ref 0–99)
Triglycerides: 59 mg/dL (ref 0–149)
VLDL Cholesterol Cal: 14 mg/dL (ref 5–40)

## 2023-12-30 ENCOUNTER — Encounter: Payer: Self-pay | Admitting: Family Medicine

## 2024-02-15 ENCOUNTER — Ambulatory Visit
Admission: EM | Admit: 2024-02-15 | Discharge: 2024-02-15 | Disposition: A | Discharge status: Home / Self Care | Payer: Medicaid Other | Attending: Nurse Practitioner | Admitting: Nurse Practitioner

## 2024-02-15 DIAGNOSIS — U071 COVID-19: Secondary | ICD-10-CM | POA: Diagnosis not present

## 2024-02-15 LAB — POC COVID19/FLU A&B COMBO
Covid Antigen, POC: POSITIVE — AB
Influenza A Antigen, POC: NEGATIVE
Influenza B Antigen, POC: NEGATIVE

## 2024-02-15 LAB — POCT RAPID STREP A (OFFICE): Rapid Strep A Screen: NEGATIVE

## 2024-02-15 MED ORDER — PAXLOVID (300/100) 20 X 150 MG & 10 X 100MG PO TBPK
3.0000 | ORAL_TABLET | Freq: Two times a day (BID) | ORAL | 0 refills | Status: AC
Start: 2024-02-15 — End: 2024-02-20

## 2024-02-15 MED ORDER — BENZONATATE 100 MG PO CAPS
100.0000 mg | ORAL_CAPSULE | Freq: Three times a day (TID) | ORAL | 0 refills | Status: AC | PRN
Start: 2024-02-15 — End: ?

## 2024-02-15 NOTE — ED Provider Notes (Signed)
 [facility]    CSN: [id number] Arrival date & time: 02/15/24  0807      History   Chief Complaint No chief complaint on file.   HPI Seth [name] is a 63 y.o. male.   Patient presents today with 1 day history of congested cough, runny and stuffy nose, sore throat, headache, and fatigue.  Reports he woke up last night coughing with congestion and it made him nervous to go back to sleep.  No fever, body aches or chills, shortness of breath or chest pain, ear pain, abdominal pain, nausea/vomiting, or diarrhea.  No change in appetite.  His girlfriend recently tested positive for COVID-19.  Has been taken over-the-counter Mucinex, Tylenol Cold syrup, and cough drops for symptoms with minimal temporary improvement.  Patient reports he quit smoking approximately 8 months ago after a heart attack.  Reports he is eating less candy, junk food, not drinking Coke anymore and is feeling much better.  He is compliant with his medications.    Past Medical History:  Diagnosis Date   BPH (benign prostatic hyperplasia)    Hyperlipidemia    Hypertension    NSTEMI (non-ST elevated myocardial infarction) (HCC)    PONV (postoperative nausea and vomiting)    Type 2 diabetes mellitus (HCC)     Patient Active Problem List   Diagnosis Date Noted   Hypertension 08/20/2023   Type 2 diabetes mellitus with complication, without long-term current use of insulin (HCC) 06/30/2023   Acute ST elevation myocardial infarction (STEMI) of anterolateral wall (HCC) 06/28/2023   NSTEMI (non-ST elevated myocardial infarction) (HCC) 06/27/2023   Tobacco abuse 06/27/2023   BPH (benign prostatic hyperplasia) 06/27/2023   Musculoskeletal pain 08/04/2011   CHEST PAIN 03/08/2010    Past Surgical History:  Procedure Laterality Date   CORONARY/GRAFT ACUTE MI REVASCULARIZATION N/A 06/28/2023   Procedure: Coronary/Graft Acute MI Revascularization;  Surgeon: Kathleene Hazel, MD;  Location: [facility];  Service: Cardiovascular;  Laterality: N/A;   HERNIA REPAIR Right    INGUINAL HERNIA REPAIR Left 02/16/2015   Procedure: LEFT INGUINAL HERNIORRHAPHY;  Surgeon: Dalia Heading, MD;  Location: [facility];  Service: General;  Laterality: Left;   INSERTION OF MESH Left 02/16/2015   Procedure: INSERTION OF MESH;  Surgeon: Dalia Heading, MD;  Location: [facility];  Service: General;  Laterality: Left;   TONSILLECTOMY         Home Medications    Prior to Admission medications   Medication Sig Start Date End Date Taking? Authorizing Provider  benzonatate (TESSALON) 100 MG capsule Take 1 capsule (100 mg total) by mouth 3 (three) times daily as needed for cough. Do not take with alcohol or while operating or driving heavy machinery 03/30/80  Yes Cathlean Marseilles A, NP  nirmatrelvir/ritonavir (PAXLOVID, 300/100,) 20 x 150 MG & 10 x 100MG  TBPK Take 3 tablets by mouth 2 (two) times daily for 5 days. Patient GFR is 99. Take nirmatrelvir (150 mg) two tablets twice daily for 5 days and ritonavir (100 mg) one tablet twice daily for 5 days. 02/15/24 02/20/24 Yes Valentino Nose, NP  aspirin EC 81 MG tablet Take 1 tablet (81 mg total) by mouth daily. Swallow whole. 07/01/23   Arty Baumgartner, NP  atorvastatin (LIPITOR) 80 MG tablet Take 1 tablet (80 mg total) by mouth daily. 07/13/23 07/12/24  Sharlene Dory, NP  carvedilol (COREG) 6.25 MG tablet Take 1 tablet (6.25 mg total) by mouth 2 (two) times daily with a  meal. Patient not taking: Reported on 12/14/2023 06/30/23   Laverda Page B, NP  clopidogrel (PLAVIX) 75 MG tablet Take 1 tablet (75 mg total) by mouth daily. 07/13/23   Sharlene Dory, NP  empagliflozin (JARDIANCE) 10 MG TABS tablet Take 1 tablet (10 mg total) by mouth daily. 07/01/23   Arty Baumgartner, NP  ibuprofen (ADVIL) 200 MG tablet Take 200 mg by mouth every 6 (six) hours as needed for headache or mild pain.    [provider]  losartan (COZAAR) 25 MG tablet Take 1 tablet (25 mg  total) by mouth daily. 07/13/23   Sharlene Dory, NP  Multiple Vitamin (MULTIVITAMIN WITH MINERALS) TABS tablet Take 1 tablet by mouth daily.    [provider]  nitroGLYCERIN (NITROSTAT) 0.4 MG SL tablet Place 1 tablet (0.4 mg total) under the tongue every 5 (five) minutes x 3 doses as needed for chest pain. 06/30/23   Arty Baumgartner, NP  tamsulosin (FLOMAX) 0.4 MG CAPS capsule Take 0.4 mg by mouth daily.    [provider]    Family History Family History  Problem Relation Age of Onset   Cancer Mother        Alzheimers?   Anuerysm Sister     Social History Social History   Tobacco Use   Smoking status: Former    Current packs/day: 1.00    Average packs/day: 1 pack/day for 44.1 years (44.1 ttl pk-yrs)    Types: Cigarettes    Start date: 1981   Smokeless tobacco: Former   Tobacco comments:    Quit date 06/27/2023  Substance Use Topics   Alcohol use: No   Drug use: Not Currently    Frequency: 0.5 times per week    Types: Marijuana    Comment: gummies     Allergies   Penicillins   Review of Systems Review of Systems Per HPI  Physical Exam Triage Vital Signs ED Triage Vitals  Encounter Vitals Group     BP 02/15/24 1005 118/78     Systolic BP Percentile --      Diastolic BP Percentile --      Pulse Rate 02/15/24 1005 75     Resp 02/15/24 1005 20     Temp 02/15/24 1005 98 F (36.7 C)     Temp Source 02/15/24 1005 Oral     SpO2 02/15/24 1005 94 %     Weight --      Height --      Head Circumference --      Peak Flow --      Pain Score 02/15/24 1003 7     Pain Loc --      Pain Education --      Exclude from Growth Chart --    No data found.  Updated Vital Signs BP 118/78 (BP Location: Right Arm)   Pulse 75   Temp 98 F (36.7 C) (Oral)   Resp 20   SpO2 94%   Visual Acuity Right Eye Distance:   Left Eye Distance:   Bilateral Distance:    Right Eye Near:   Left Eye Near:    Bilateral Near:     Physical Exam Vitals and  nursing note reviewed.  Constitutional:      General: He is not in acute distress.    Appearance: Normal appearance. He is not ill-appearing or toxic-appearing.  HENT:     Head: Normocephalic and atraumatic.     Right Ear: Tympanic membrane, ear canal and  external ear normal.     Left Ear: Tympanic membrane, ear canal and external ear normal.     Nose: No congestion or rhinorrhea.     Mouth/Throat:     Mouth: Mucous membranes are moist.     Pharynx: Oropharynx is clear. Posterior oropharyngeal erythema present. No oropharyngeal exudate.  Eyes:     General: No scleral icterus.    Extraocular Movements: Extraocular movements intact.  Cardiovascular:     Rate and Rhythm: Normal rate and regular rhythm.  Pulmonary:     Effort: Pulmonary effort is normal. No respiratory distress.     Breath sounds: Normal breath sounds. No wheezing, rhonchi or rales.  Musculoskeletal:     Cervical back: Normal range of motion and neck supple.  Lymphadenopathy:     Cervical: No cervical adenopathy.  Skin:    General: Skin is warm and dry.     Coloration: Skin is not jaundiced or pale.     Findings: No erythema or rash.  Neurological:     Mental Status: He is alert and oriented to person, place, and time.  Psychiatric:        Behavior: Behavior is cooperative.      UC Treatments / Results  Labs (all labs ordered are listed, but only abnormal results are displayed) Labs Reviewed  POC COVID19/FLU A&B COMBO - Abnormal; Notable for the following components:      Result Value   Covid Antigen, POC Positive (*)    All other components within normal limits  POCT RAPID STREP A (OFFICE) - Normal    EKG   Radiology No results found.  Procedures Procedures (including critical care time)  Medications Ordered in UC Medications - No data to display  Initial Impression / Assessment and Plan / UC Course  I have reviewed the triage vital signs and the nursing notes.  Pertinent labs & imaging  results that were available during my care of the patient were reviewed by me and considered in my medical decision making (see chart for details).   Patient is well-appearing, normotensive, afebrile, not tachycardic, not tachypneic, oxygenating well on room air.    1. COVID-19 Vitals and exam are reassuring Suspect mild illness Given history of recent MI, will treat with Paxlovid twice daily for 5 days; most recent GFR 99 Recommended holding atorvastatin and tamsulosin while on treatment with Paxlovid and for 3 days thereafter Other supportive care discussed including guaifenesin, Tessalon Perles as needed Return and ER precautions discussed Work excuse provided  The patient was given the opportunity to ask questions.  All questions answered to their satisfaction.  The patient is in agreement to this plan.   Final Clinical Impressions(s) / UC Diagnoses   Final diagnoses:  COVID-19     Discharge Instructions      You tested positive for COVID-19 today.  Take the Paxlovid as prescribed to treat it.  While you are taking Paxlovid and for 3 days after, do not take any atorvastatin or tamsulosin.    Symptoms should improve over the next few days.  If you develop chest pain or shortness of breath, please seek care emergently.  Some things that can make you feel better are: - Increased rest - Increasing fluid with water/sugar free electrolytes - Acetaminophen and ibuprofen as needed for fever/pain - Salt water gargling, chloraseptic spray and throat lozenges - OTC guaifenesin (Mucinex) 600 mg twice daily - Saline sinus flushes or a neti pot - Humidifying the air -Tessalon Perles every 8  hours as needed for dry cough      ED Prescriptions     Medication Sig Dispense Auth. Provider   nirmatrelvir/ritonavir (PAXLOVID, 300/100,) 20 x 150 MG & 10 x 100MG  TBPK Take 3 tablets by mouth 2 (two) times daily for 5 days. Patient GFR is 99. Take nirmatrelvir (150 mg) two tablets twice daily  for 5 days and ritonavir (100 mg) one tablet twice daily for 5 days. 30 tablet Cathlean Marseilles A, NP   benzonatate (TESSALON) 100 MG capsule Take 1 capsule (100 mg total) by mouth 3 (three) times daily as needed for cough. Do not take with alcohol or while operating or driving heavy machinery 21 capsule Valentino Nose, NP      PDMP not reviewed this encounter.   Valentino Nose, NP 02/15/24 1141

## 2024-02-15 NOTE — Discharge Instructions (Addendum)
 You tested positive for COVID-19 today.  Take the Paxlovid as prescribed to treat it.  While you are taking Paxlovid and for 3 days after, do not take any atorvastatin or tamsulosin.    Symptoms should improve over the next few days.  If you develop chest pain or shortness of breath, please seek care emergently.  Some things that can make you feel better are: - Increased rest - Increasing fluid with water/sugar free electrolytes - Acetaminophen and ibuprofen as needed for fever/pain - Salt water gargling, chloraseptic spray and throat lozenges - OTC guaifenesin (Mucinex) 600 mg twice daily - Saline sinus flushes or a neti pot - Humidifying the air -Tessalon Perles every 8 hours as needed for dry cough

## 2024-02-15 NOTE — ED Triage Notes (Signed)
 Pt reports sore throat, nasal congestion, cough, pt has been using OTC medicine has found no relief.

## 2024-03-11 ENCOUNTER — Ambulatory Visit: Payer: Medicaid Other | Attending: Internal Medicine | Admitting: Internal Medicine

## 2024-03-11 NOTE — Progress Notes (Signed)
 Erroneous encounter - please disregard.

## 2024-06-14 ENCOUNTER — Telehealth: Payer: Self-pay

## 2024-06-14 NOTE — Telephone Encounter (Signed)
 Attempted call to Care connect client who had transitioned to [facility], he had previously been at [facility] and was last seen 03/01/24 and no further appointments.  Call today was to confirm he is established with a primary care provider. No answer and message states he is unavailable.   Avelina JONELLE Skeen RN [employer]

## 2024-06-17 ENCOUNTER — Encounter: Payer: Self-pay | Admitting: Emergency Medicine

## 2024-07-05 ENCOUNTER — Ambulatory Visit: Admitting: Nurse Practitioner

## 2024-08-24 NOTE — Progress Notes (Deleted)
 Cardiology Office Note:  .   Date:  08/24/2024  ID:  Seth [name], DOB [birth year], MRN [id number] PCP: [facility]  [facility] Providers Cardiologist:  Dorn Lesches, MD {  History of Present Illness: .   Seth [name] is a 63 y.o. male  with PMHx of CAD (s/p NSTEMI in 06/2023 s/p DES to mLAD and DES to diagonal), HFpEF (TTE 06/2023 revealed EF 45 to 50% with resolution in EF 09/2023, approximately 55%), HLD, T2DM, tobacco use, and BPH who reports to [facility] office for follow up.   Last seen in heartcare OV 12/14/2023 with Almarie Crate, NP for 6-week follow-up.  Reported ongoing atypical chest pain that last 1 minute with no associated triggers.  Denied any anginal symptoms.  Suspect MSK related to CP.  Otherwise doing well from cardiac standpoint without any cardiac complaints.  Continued on ASA 81 mg daily, Lipitor 80 mg daily, Coreg  6.25 mg twice daily, Plavix  75 mg daily, Jardiance  10 mg daily, losartan  25 mg daily, NTG as needed. Follow up Labs 12/2023 CBC/CMP/FLP WNL and A1C 6.9.   Seen in  [facility] on 01/24/2024 and tested positive for COVID.  Treated with Paxlovid .   Today, reports ### and denies ###.  Reports compliance with medications.  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  Social: Quit smoking cigarettes ?? denies alcohol/drug use  Denies any recent hospitalizations or visits to the emergency department.   Coronary artery disease involving native heart without angina pectoris, unspecified vessel or lesion type Hyperlipidemia LDL goal <70 Concerns for NSTEMI leading to cardiac cath on 06/28/2023 s/p  PCI/DES x 1 to mLAD and DES x 1 to diagonal. Moderate nonobstructive residual disease along mLAD was recommended to be treated medically. Was also given IV Lasix  in Cath Lab for elevated LV filling pressures. Recommended for DAPT with ASA/Brilinta  x 1 year. TTE EF 45-50%, grade 1 DD.  Cr 0.83 and K  4.2 in 12/2023.  Continue ASA 81 mg daily, Lipitor 80 mg daily, Coreg  6.25 mg twice daily, Plavix  75 mg daily, losartan  25 mg daily, NTG as needed  Heart failure with preserved ejection fraction (HFpEF) (HCC) TTE 06/2023 revealed EF 45 to 50% with resolution in EF 09/2023, approximately 55%.  ECHO 09/2023: EF 55%, G1DD, trivial MV regurgitation.  Denies SOB, edema, orthopnea, or PND. Appears Euvolemic on exam.  Continue on Jardiance  10 mg daily and Coreg /losartan  as above Encouraged low sodium diet, fluid restriction <2L, and daily weights.  Educated to contact our office for weight gain of 2 lbs overnight or 5 lbs in one week. ED precautions discussed.    Type 2 diabetes mellitus with complication, without long-term current use of insulin  (HCC) A1C 6.9 in 12/2023 Followed by PCP  ROS: 10 point review of system has been reviewed and considered negative except ones been listed in the HPI.   Studies Reviewed: .   Echo 06/2023:  1. Left ventricular ejection fraction, by estimation, is 45 to 50%. The  left ventricle has mildly decreased function. The left ventricle  demonstrates regional wall motion abnormalities (see scoring  diagram/findings for description). There is mild  concentric left ventricular hypertrophy. Left ventricular diastolic  parameters are consistent with Grade I diastolic dysfunction (impaired  relaxation).   2. Right ventricular systolic function is normal. The right ventricular  size is normal. Tricuspid regurgitation signal is inadequate for assessing  PA pressure.   3. The mitral valve is grossly normal.  Trivial mitral valve  regurgitation.   4. The aortic valve is tricuspid. Aortic valve regurgitation is not  visualized.   5. Unable to estimate CVP.   Comparison(s): No prior Echocardiogram.   LHC 06/2023:    Prox RCA to Mid RCA lesion is 20% stenosed.   Prox Cx lesion is 20% stenosed.   Prox LAD to Mid LAD lesion is 99% stenosed.   1st Diag lesion is 99%  stenosed.   Mid LAD lesion is 50% stenosed.   A drug-eluting stent was successfully placed using a SYNERGY XD 3.0X12.   A drug-eluting stent was successfully placed using a SYNERGY XD 3.0X16.   Post intervention, there is a 0% residual stenosis.   Post intervention, there is a 0% residual stenosis.   Acute anterolateral STEMI secondary to thrombotic sub-total occlusion of the mid LAD and large Diagonal branch.  Successful PTCA/DES x 1 mid LAD Successful PTCA/DES x 1 Diagonal Moderate non-obstructive residual disease in the mid LAD beyond the stented segment Mild non-obstructive disease in the moderate caliber Circumflex and in the large dominant RCA Elevated LV filling pressure-40 mg IV Lasix  given in the cath lab (LV 148/19/32) LV gram with hypokinesis of the anterior and apical walls.    Recommendations: Will transfer to the ICU. Echo today. Continue Aggrastat  for 2 hours. DAPT with ASA and Brilinta  for one year. High intensity statin. I anticipate that he will need to be on a beta blocker but given soft BP post cath will not start today. He was given IV Lasix  in the cath lab. Will write for an additional 40 mg IV lasix  tonight.   Risk Assessment/Calculations:   {Does this patient have ATRIAL FIBRILLATION?[phone]} No BP recorded.  {Refresh Note OR Click here to enter BP  :1}***       Physical Exam:   VS:  There were no vitals taken for this visit.   Wt Readings from Last 3 Encounters:  12/14/23 225 lb (102.1 kg)  09/21/23 227 lb 1.6 oz (103 kg)  09/14/23 223 lb (101.2 kg)    GEN: Well nourished, well developed in no acute distress while sitting in chair.  NECK: No JVD; No carotid bruits CARDIAC: ***RRR, no murmurs, rubs, gallops RESPIRATORY:  Clear to auscultation without rales, wheezing or rhonchi  ABDOMEN: Soft, non-tender, non-distended EXTREMITIES:  No edema; No deformity   ASSESSMENT AND PLAN: .   ***    {Are you ordering a CV Procedure (e.g. stress test, cath, DCCV,  TEE, etc)?   Press F2        :[id number]}  Dispo: ***  Signed, Lorette CINDERELLA Kapur, PA-C

## 2024-08-26 ENCOUNTER — Ambulatory Visit: Admitting: Nurse Practitioner

## 2024-08-26 DIAGNOSIS — E785 Hyperlipidemia, unspecified: Secondary | ICD-10-CM

## 2024-08-26 DIAGNOSIS — I2510 Atherosclerotic heart disease of native coronary artery without angina pectoris: Secondary | ICD-10-CM

## 2024-08-26 DIAGNOSIS — I5032 Chronic diastolic (congestive) heart failure: Secondary | ICD-10-CM

## 2024-08-26 DIAGNOSIS — E118 Type 2 diabetes mellitus with unspecified complications: Secondary | ICD-10-CM

## 2024-09-20 ENCOUNTER — Emergency Department (HOSPITAL_COMMUNITY): Payer: Self-pay

## 2024-09-20 ENCOUNTER — Emergency Department (HOSPITAL_COMMUNITY)
Admission: EM | Admit: 2024-09-20 | Discharge: 2024-09-20 | Disposition: A | Discharge status: Home / Self Care | Payer: Self-pay | Attending: Emergency Medicine | Admitting: Emergency Medicine

## 2024-09-20 ENCOUNTER — Encounter (HOSPITAL_COMMUNITY): Payer: Self-pay

## 2024-09-20 ENCOUNTER — Other Ambulatory Visit: Payer: Self-pay

## 2024-09-20 DIAGNOSIS — Z79899 Other long term (current) drug therapy: Secondary | ICD-10-CM | POA: Insufficient documentation

## 2024-09-20 DIAGNOSIS — I2510 Atherosclerotic heart disease of native coronary artery without angina pectoris: Secondary | ICD-10-CM | POA: Insufficient documentation

## 2024-09-20 DIAGNOSIS — R0789 Other chest pain: Secondary | ICD-10-CM | POA: Insufficient documentation

## 2024-09-20 DIAGNOSIS — I10 Essential (primary) hypertension: Secondary | ICD-10-CM | POA: Insufficient documentation

## 2024-09-20 DIAGNOSIS — Z7901 Long term (current) use of anticoagulants: Secondary | ICD-10-CM | POA: Insufficient documentation

## 2024-09-20 DIAGNOSIS — Z7982 Long term (current) use of aspirin: Secondary | ICD-10-CM | POA: Insufficient documentation

## 2024-09-20 LAB — HEPATIC FUNCTION PANEL
ALT: 23 U/L (ref 0–44)
AST: 17 U/L (ref 15–41)
Albumin: 3.8 g/dL (ref 3.5–5.0)
Alkaline Phosphatase: 83 U/L (ref 38–126)
Bilirubin, Direct: 0.2 mg/dL (ref 0.0–0.2)
Indirect Bilirubin: 0.8 mg/dL (ref 0.3–0.9)
Total Bilirubin: 1 mg/dL (ref 0.0–1.2)
Total Protein: 6.6 g/dL (ref 6.5–8.1)

## 2024-09-20 LAB — CBC
HCT: 43.6 % (ref 39.0–52.0)
Hemoglobin: 14.8 g/dL (ref 13.0–17.0)
MCH: 28.7 pg (ref 26.0–34.0)
MCHC: 33.9 g/dL (ref 30.0–36.0)
MCV: 84.7 fL (ref 80.0–100.0)
Platelets: 259 K/uL (ref 150–400)
RBC: 5.15 MIL/uL (ref 4.22–5.81)
RDW: 13.5 % (ref 11.5–15.5)
WBC: 9 K/uL (ref 4.0–10.5)
nRBC: 0 % (ref 0.0–0.2)

## 2024-09-20 LAB — TROPONIN T, HIGH SENSITIVITY: Troponin T High Sensitivity: <15 ng/L (ref 0–19)

## 2024-09-20 LAB — BASIC METABOLIC PANEL WITH GFR
Anion gap: 9 (ref 5–15)
BUN: 13 mg/dL (ref 8–23)
CO2: 23 mmol/L (ref 22–32)
Calcium: 8.8 mg/dL — ABNORMAL LOW (ref 8.9–10.3)
Chloride: 104 mmol/L (ref 98–111)
Creatinine, Ser: 0.65 mg/dL (ref 0.61–1.24)
GFR, Estimated: >60 mL/min (ref >60)
Glucose, Bld: 279 mg/dL — ABNORMAL HIGH (ref 70–99)
Potassium: 3.9 mmol/L (ref 3.5–5.1)
Sodium: 136 mmol/L (ref 135–145)

## 2024-09-20 LAB — TROPONIN I (HIGH SENSITIVITY): Troponin I (High Sensitivity): 4 ng/L (ref <18)

## 2024-09-20 MED ORDER — SODIUM CHLORIDE 0.9 % IV BOLUS
500.0000 mL | Freq: Once | INTRAVENOUS | Status: AC
  Administered 2024-09-20: 500 mL via INTRAVENOUS

## 2024-09-20 MED ORDER — HYDROCODONE-ACETAMINOPHEN 5-325 MG PO TABS
1.0000 | ORAL_TABLET | Freq: Once | ORAL | Status: AC
  Administered 2024-09-20: 1 via ORAL
  Filled 2024-09-20: qty 1

## 2024-09-20 MED ORDER — PANTOPRAZOLE SODIUM 40 MG IV SOLR
40.0000 mg | Freq: Once | INTRAVENOUS | Status: AC
  Administered 2024-09-20: 40 mg via INTRAVENOUS
  Filled 2024-09-20: qty 10

## 2024-09-20 NOTE — ED Provider Notes (Signed)
 [place] EMERGENCY DEPARTMENT AT [facility] Provider Note   CSN: [id number] Arrival date & time: 09/20/24  9298     Patient presents with: Chest Pain   Seth [name] is a 63 y.o. male.  {Add pertinent medical, surgical, social history, OB history to YEP:67052} Patient has a history of hypertension and coronary disease.  He stated he was lifting something heavy recently and had been having mild chest discomfort on the left.  Patient also states his blood pressure has been running high lately and today he took 50 mg of his Cozaar  instead of 25   Chest Pain      Prior to Admission medications   Medication Sig Start Date End Date Taking? Authorizing Provider  aspirin  EC 81 MG tablet Take 1 tablet (81 mg total) by mouth daily. Swallow whole. 07/01/23   Henry Manuelita NOVAK, NP  atorvastatin  (LIPITOR) 80 MG tablet Take 1 tablet (80 mg total) by mouth daily. 07/13/23 07/12/24  Miriam Norris, NP  benzonatate  (TESSALON ) 100 MG capsule Take 1 capsule (100 mg total) by mouth 3 (three) times daily as needed for cough. Do not take with alcohol or while operating or driving heavy machinery 7/75/74   Chandra Harlene LABOR, NP  carvedilol  (COREG ) 6.25 MG tablet Take 1 tablet (6.25 mg total) by mouth 2 (two) times daily with a meal. Patient not taking: Reported on 12/14/2023 06/30/23   Henry Manuelita NOVAK, NP  clopidogrel  (PLAVIX ) 75 MG tablet Take 1 tablet (75 mg total) by mouth daily. 07/13/23   Miriam Norris, NP  empagliflozin  (JARDIANCE ) 10 MG TABS tablet Take 1 tablet (10 mg total) by mouth daily. 07/01/23   Henry Manuelita NOVAK, NP  ibuprofen (ADVIL) 200 MG tablet Take 200 mg by mouth every 6 (six) hours as needed for headache or mild pain.    [provider]  losartan  (COZAAR ) 25 MG tablet Take 1 tablet (25 mg total) by mouth daily. 07/13/23   Miriam Norris, NP  Multiple Vitamin (MULTIVITAMIN WITH MINERALS) TABS tablet Take 1 tablet by mouth daily.    [provider]   nitroGLYCERIN  (NITROSTAT ) 0.4 MG SL tablet Place 1 tablet (0.4 mg total) under the tongue every 5 (five) minutes x 3 doses as needed for chest pain. 06/30/23   Henry Manuelita NOVAK, NP  tamsulosin  (FLOMAX ) 0.4 MG CAPS capsule Take 0.4 mg by mouth daily.    [provider]    Allergies: Penicillins    Review of Systems  Cardiovascular:  Positive for chest pain.    Updated Vital Signs BP 102/68 (BP Location: Left Arm)   Pulse 71   Temp 98.7 F (37.1 C) (Oral)   Resp 16   Ht 6' 3 (1.905 m)   Wt 111.1 kg   SpO2 93%   BMI 30.62 kg/m   Physical Exam  (all labs ordered are listed, but only abnormal results are displayed) Labs Reviewed  BASIC METABOLIC PANEL WITH GFR - Abnormal; Notable for the following components:      Result Value   Glucose, Bld 279 (*)    Calcium  8.8 (*)    All other components within normal limits  CBC  HEPATIC FUNCTION PANEL  TROPONIN I (HIGH SENSITIVITY)  TROPONIN T, HIGH SENSITIVITY    EKG: EKG Interpretation Date/Time:  Tuesday September 20 2024 07:18:04 EDT Ventricular Rate:  89 PR Interval:  160 QRS Duration:  92 QT Interval:  346 QTC Calculation: 421 R Axis:   64  Text Interpretation: Sinus rhythm  Left atrial enlargement Confirmed by Suzette Pac [phone]) on 09/20/2024 11:21:10 AM  Radiology: DG Chest 2 View Result Date: 09/20/2024 CLINICAL DATA:  Chest pain and hypertension. EXAM: CHEST - 2 VIEW COMPARISON:  PA Lat chest 06/27/2023, CTA chest 06/27/2023. FINDINGS: The heart size and mediastinal contours are within normal limits. Both lungs are mildly emphysematous but clear. The visualized skeletal structures are unremarkable. There is a tangle of overlying monitor wiring. IMPRESSION: No evidence of acute chest disease.  Mild emphysema. Electronically Signed   By: Francis Quam M.D.   On: 09/20/2024 07:40    {Document cardiac monitor, telemetry assessment procedure when appropriate:32947} Procedures   Medications Ordered in the  ED  sodium chloride  0.9 % bolus 500 mL (0 mLs Intravenous Stopped 09/20/24 0852)  pantoprazole  (PROTONIX ) injection 40 mg (40 mg Intravenous Given 09/20/24 0809)  HYDROcodone-acetaminophen  (NORCO/VICODIN) 5-325 MG per tablet 1 tablet (1 tablet Oral Given 09/20/24 0809)      {Click here for ABCD2, HEART and other calculators REFRESH Note before signing:1}                              Medical Decision Making Amount and/or Complexity of Data Reviewed Labs: ordered. Radiology: ordered.  Risk Prescription drug management.   Patient with atypical chest discomfort and normal troponins with hypertension.  He will continue taking 2 Cozaar  tablets a day for his blood pressure and follow-up with his family doctor and his cardiologist  {Document critical care time when appropriate  Document review of labs and clinical decision tools ie CHADS2VASC2, etc  Document your independent review of radiology images and any outside records  Document your discussion with family members, caretakers and with consultants  Document social determinants of health affecting pt's care  Document your decision making why or why not admission, treatments were needed:32947:::1}   Final diagnoses:  Atypical chest pain    ED Discharge Orders     None

## 2024-09-20 NOTE — Discharge Instructions (Signed)
 Continue taking 2 of your losartan  pills daily for blood pressure.  Follow-up with your family doctor next week for your blood pressure and follow-up with your cardiologist in the next month.  If you have any problems return to the emergency department for evaluation

## 2024-09-20 NOTE — ED Triage Notes (Signed)
 Pt c/o of chest discomfort, pt states pain is 2/10; hx of hypertension with recent readings approx. 175/115 average. Denies N/V, diarrhea x3 days. Headache rated 2/10, described as pressure. A&Ox4

## 2024-10-17 ENCOUNTER — Ambulatory Visit: Admitting: Nurse Practitioner
